# Patient Record
Sex: Female | Born: 1990 | Race: Black or African American | Hispanic: No | Marital: Single | Smoking: Former smoker
Health system: Southern US, Community
[De-identification: ages and names within clinical notes are randomized; demographics above are authoritative.]

## PROBLEM LIST (undated history)

## (undated) ENCOUNTER — Inpatient Hospital Stay (HOSPITAL_COMMUNITY): Payer: Self-pay

## (undated) DIAGNOSIS — K8689 Other specified diseases of pancreas: Secondary | ICD-10-CM

## (undated) DIAGNOSIS — K047 Periapical abscess without sinus: Secondary | ICD-10-CM

## (undated) DIAGNOSIS — N200 Calculus of kidney: Secondary | ICD-10-CM

## (undated) DIAGNOSIS — Z789 Other specified health status: Secondary | ICD-10-CM

## (undated) HISTORY — PX: NO PAST SURGERIES: SHX2092

---

## 2010-12-02 DIAGNOSIS — O365990 Maternal care for other known or suspected poor fetal growth, unspecified trimester, not applicable or unspecified: Secondary | ICD-10-CM

## 2014-10-25 ENCOUNTER — Encounter (HOSPITAL_COMMUNITY): Payer: Self-pay | Admitting: *Deleted

## 2014-10-25 ENCOUNTER — Emergency Department (HOSPITAL_COMMUNITY)
Admission: EM | Admit: 2014-10-25 | Discharge: 2014-10-25 | Disposition: A | Discharge status: Home / Self Care | Payer: Self-pay | Attending: Emergency Medicine | Admitting: Emergency Medicine

## 2014-10-25 DIAGNOSIS — K0889 Other specified disorders of teeth and supporting structures: Secondary | ICD-10-CM

## 2014-10-25 DIAGNOSIS — K002 Abnormalities of size and form of teeth: Secondary | ICD-10-CM | POA: Insufficient documentation

## 2014-10-25 DIAGNOSIS — K088 Other specified disorders of teeth and supporting structures: Secondary | ICD-10-CM | POA: Insufficient documentation

## 2014-10-25 DIAGNOSIS — Z720 Tobacco use: Secondary | ICD-10-CM | POA: Insufficient documentation

## 2014-10-25 DIAGNOSIS — H9209 Otalgia, unspecified ear: Secondary | ICD-10-CM | POA: Insufficient documentation

## 2014-10-25 MED ORDER — OXYCODONE-ACETAMINOPHEN 5-325 MG PO TABS: 1.0000 | ORAL_TABLET | ORAL | Status: DC | PRN

## 2014-10-25 MED ORDER — PENICILLIN V POTASSIUM 250 MG PO TABS
500.0000 mg | ORAL_TABLET | Freq: Once | ORAL | Status: AC
  Administered 2014-10-25: 500 mg via ORAL
  Filled 2014-10-25: qty 2

## 2014-10-25 MED ORDER — OXYCODONE-ACETAMINOPHEN 5-325 MG PO TABS
2.0000 | ORAL_TABLET | Freq: Once | ORAL | Status: AC
  Administered 2014-10-25: 2 via ORAL
  Filled 2014-10-25: qty 2

## 2014-10-25 MED ORDER — PENICILLIN V POTASSIUM 500 MG PO TABS: 500.0000 mg | ORAL_TABLET | Freq: Four times a day (QID) | ORAL | Status: DC

## 2014-10-25 NOTE — ED Provider Notes (Signed)
CSN: [id number]     Arrival date & time 10/25/14  1215 History   First MD Initiated Contact with Patient 10/25/14 1223     Chief Complaint  Patient presents with  . Dental Pain  . Otalgia     (Consider location/radiation/quality/duration/timing/severity/associated sxs/prior Treatment) HPI Comments: The patient is a 24 year old otherwise healthy female who presents with dental pain that started gradually 2 days ago. The dental pain is severe, constant and progressively worsening. The pain is aching and located in left lower molars. The pain does not radiate. Eating makes the pain worse. Nothing makes the pain better. The patient has tried ibuprofen for pain without relief. She reports associated facial swelling and ear pain. Patient denies headache, neck pain/stiffness, fever, NVD, edema, sore throat, throat swelling, wheezing, SOB, chest pain, abdominal pain.     Patient is a 24 y.o. female presenting with tooth pain and ear pain.  Dental Pain Associated symptoms: facial swelling   Associated symptoms: no fever and no neck pain   Otalgia Associated symptoms: no abdominal pain, no diarrhea, no fever, no neck pain and no vomiting     History reviewed. No pertinent past medical history. History reviewed. No pertinent past surgical history. History reviewed. No pertinent family history. History  Substance Use Topics  . Smoking status: Current Every Day Smoker    Types: Cigarettes  . Smokeless tobacco: Not on file  . Alcohol Use: No   OB History    No data available     Review of Systems  Constitutional: Negative for fever, chills and fatigue.  HENT: Positive for dental problem, ear pain and facial swelling. Negative for trouble swallowing.   Eyes: Negative for visual disturbance.  Respiratory: Negative for shortness of breath.   Cardiovascular: Negative for chest pain and palpitations.  Gastrointestinal: Negative for nausea, vomiting, abdominal pain and diarrhea.   Genitourinary: Negative for dysuria and difficulty urinating.  Musculoskeletal: Negative for arthralgias and neck pain.  Skin: Negative for color change.  Neurological: Negative for dizziness and weakness.  Psychiatric/Behavioral: Negative for dysphoric mood.      Allergies  Review of patient's allergies indicates no known allergies.  Home Medications   Prior to Admission medications   Medication Sig Start Date End Date Taking? Authorizing Provider  ibuprofen (ADVIL,MOTRIN) 400 MG tablet Take 400 mg by mouth every 6 (six) hours as needed for mild pain.   Yes Historical Provider, MD   BP 130/72 mmHg  Pulse 79  Temp(Src) 98.3 F (36.8 C) (Oral)  Resp 20  SpO2 99%  LMP 10/03/2014 Physical Exam  Constitutional: She is oriented to person, place, and time. She appears well-developed and well-nourished. No distress.  HENT:  Head: Normocephalic and atraumatic.  Mouth/Throat: Oropharynx is clear and moist. No oropharyngeal exudate.  Poor dentition. Left lower molar mild tenderness to percussion. No abscess noted.   Eyes: Conjunctivae and EOM are normal.  Neck: Normal range of motion. Neck supple.  Left side cervical adenopathy  Cardiovascular: Normal rate and regular rhythm.  Exam reveals no gallop and no friction rub.   No murmur heard. Pulmonary/Chest: Effort normal and breath sounds normal. She has no wheezes. She has no rales. She exhibits no tenderness.  Abdominal: Soft. There is no tenderness.  Musculoskeletal: Normal range of motion.  Lymphadenopathy:    She has cervical adenopathy.  Neurological: She is alert and oriented to person, place, and time. Coordination normal.  Speech is goal-oriented. Moves limbs without ataxia.   Skin: Skin is  warm and dry.  Psychiatric: She has a normal mood and affect. Her behavior is normal.  Nursing note and vitals reviewed.   ED Course  Procedures (including critical care time) Labs Review Labs Reviewed - No data to  display  Imaging Review No results found.   EKG Interpretation None      MDM   Final diagnoses:  Pain, dental    1:03 PM No signs of ludwigs angina. Vitals stable and patient afebrile. No abscess noted or throat swelling. Patient will be discharged with Veetid and Percocet and instructions to follow up with on call dentist. Patient instructed to return to the ED with worsening or concerning symptoms.   Emilia Beck, PA-C 10/25/14 1311  Richardean Canal, MD 10/25/14 [phone]

## 2014-10-25 NOTE — ED Notes (Addendum)
Pt reports left lower dental pain intermittent for extended amount of time. Now has pain and swelling into left ear and into her throat, reports difficulty swallowing. Airway intact.

## 2014-10-25 NOTE — Discharge Instructions (Signed)
Take veetid as directed until gone. Take percocet as needed for pain. Follow up with the recommended dentist. Refer to attached documents for more information.

## 2014-10-27 ENCOUNTER — Emergency Department (HOSPITAL_COMMUNITY): Payer: Self-pay

## 2014-10-27 ENCOUNTER — Encounter (HOSPITAL_COMMUNITY): Payer: Self-pay | Admitting: *Deleted

## 2014-10-27 ENCOUNTER — Emergency Department (HOSPITAL_COMMUNITY)
Admission: EM | Admit: 2014-10-27 | Discharge: 2014-10-27 | Disposition: A | Discharge status: Home / Self Care | Payer: Self-pay | Attending: Emergency Medicine | Admitting: Emergency Medicine

## 2014-10-27 DIAGNOSIS — K0889 Other specified disorders of teeth and supporting structures: Secondary | ICD-10-CM

## 2014-10-27 DIAGNOSIS — K029 Dental caries, unspecified: Secondary | ICD-10-CM | POA: Insufficient documentation

## 2014-10-27 DIAGNOSIS — K047 Periapical abscess without sinus: Secondary | ICD-10-CM | POA: Insufficient documentation

## 2014-10-27 DIAGNOSIS — Z792 Long term (current) use of antibiotics: Secondary | ICD-10-CM | POA: Insufficient documentation

## 2014-10-27 DIAGNOSIS — Z720 Tobacco use: Secondary | ICD-10-CM | POA: Insufficient documentation

## 2014-10-27 DIAGNOSIS — K088 Other specified disorders of teeth and supporting structures: Secondary | ICD-10-CM | POA: Insufficient documentation

## 2014-10-27 HISTORY — DX: Periapical abscess without sinus: K04.7

## 2014-10-27 LAB — BASIC METABOLIC PANEL
ANION GAP: 8 (ref 5–15)
BUN: 8 mg/dL (ref 6–20)
CALCIUM: 9.5 mg/dL (ref 8.9–10.3)
CHLORIDE: 102 mmol/L (ref 101–111)
CO2: 25 mmol/L (ref 22–32)
Creatinine, Ser: 0.8 mg/dL (ref 0.44–1.00)
GFR calc non Af Amer: >60 mL/min (ref >60)
Glucose, Bld: 98 mg/dL (ref 65–99)
Potassium: 3.9 mmol/L (ref 3.5–5.1)
SODIUM: 135 mmol/L (ref 135–145)

## 2014-10-27 LAB — CBC WITH DIFFERENTIAL/PLATELET
BASOS ABS: 0 10*3/uL (ref 0.0–0.1)
BASOS PCT: 0 % (ref 0–1)
EOS PCT: 2 % (ref 0–5)
Eosinophils Absolute: 0.1 10*3/uL (ref 0.0–0.7)
HCT: 39.8 % (ref 36.0–46.0)
HEMOGLOBIN: 13.7 g/dL (ref 12.0–15.0)
LYMPHS ABS: 1.4 10*3/uL (ref 0.7–4.0)
Lymphocytes Relative: 28 % (ref 12–46)
MCH: 31.2 pg (ref 26.0–34.0)
MCHC: 34.4 g/dL (ref 30.0–36.0)
MCV: 90.7 fL (ref 78.0–100.0)
Monocytes Absolute: 0.9 10*3/uL (ref 0.1–1.0)
Monocytes Relative: 17 % — ABNORMAL HIGH (ref 3–12)
NEUTROS ABS: 2.7 10*3/uL (ref 1.7–7.7)
Neutrophils Relative %: 53 % (ref 43–77)
PLATELETS: 283 10*3/uL (ref 150–400)
RBC: 4.39 MIL/uL (ref 3.87–5.11)
RDW: 13 % (ref 11.5–15.5)
WBC: 5.1 10*3/uL (ref 4.0–10.5)

## 2014-10-27 MED ORDER — IOHEXOL 300 MG/ML  SOLN
75.0000 mL | Freq: Once | INTRAMUSCULAR | Status: AC | PRN
  Administered 2014-10-27: 75 mL via INTRAVENOUS

## 2014-10-27 NOTE — ED Notes (Signed)
Pt was seen and treated on 10-25-14 for dental pain. Today Pt presents with swelling under tongue making it hard to swallow. Pt speaking in full sentences and no resp. Distress.

## 2014-10-27 NOTE — Discharge Instructions (Signed)

## 2014-10-27 NOTE — ED Provider Notes (Signed)
CSN: [id number]     Arrival date & time 10/27/14  1247 History  This chart was scribed for non-physician practitioner, Santiago Glad, PA-C, working with Richardean Canal, MD, by Ronney Lion, ED Scribe. This patient was seen in room TR07C/TR07C and the patient's care was started at 1:21 PM.     Chief Complaint  Patient presents with  . Oral Swelling   The history is provided by the patient. No language interpreter was used.     HPI Comments: Beth Morrow is a 24 y.o. female who presents to the Emergency Department complaining of constant pain and swelling underneath her tongue that began today. She states she had difficulty eating yesterday due to the swelling, although she states she was able to eat pizza at midnight last night, about 13 hours ago. Patient was seen and treated here 2 days ago for dental pain, adding that she did not have of today's symptoms then. She was given Rx for Percocet and PCN at that time, which she reports that she is taking.  She does mention that her dental pain is no longer bothering her. She denies fever, nausea, vomiting, sore throat, or SOB.   Past Medical History  Diagnosis Date  . Dental abscess    History reviewed. No pertinent past surgical history. History reviewed. No pertinent family history. History  Substance Use Topics  . Smoking status: Current Every Day Smoker    Types: Cigarettes  . Smokeless tobacco: Not on file  . Alcohol Use: No   OB History    No data available     Review of Systems  Constitutional: Negative for fever.  HENT: Positive for dental problem. Negative for drooling and facial swelling.        Positive for sublingual pain and swelling  Respiratory: Negative for shortness of breath.   Gastrointestinal: Negative for nausea and vomiting.      Allergies  Review of patient's allergies indicates no known allergies.  Home Medications   Prior to Admission medications   Medication Sig Start Date End Date Taking?  Authorizing Provider  ibuprofen (ADVIL,MOTRIN) 400 MG tablet Take 400 mg by mouth every 6 (six) hours as needed for mild pain.    Historical Provider, MD  oxyCODONE-acetaminophen (PERCOCET/ROXICET) 5-325 MG per tablet Take 1-2 tablets by mouth every 4 (four) hours as needed for severe pain. 10/25/14   Kaitlyn Szekalski, PA-C  penicillin v potassium (VEETID) 500 MG tablet Take 1 tablet (500 mg total) by mouth 4 (four) times daily. 10/25/14   Kaitlyn Szekalski, PA-C   BP 128/79 mmHg  Pulse 84  Temp(Src) 98.6 F (37 C) (Oral)  Resp 16  Ht  (1.6 m)  Wt 133 lb 8 oz (60.555 kg)  BMI 23.65 kg/m2  SpO2 98%  LMP 10/03/2014 Physical Exam  Constitutional: She is oriented to person, place, and time. She appears well-developed and well-nourished. No distress.  HENT:  Head: Normocephalic and atraumatic.  Mouth/Throat: Uvula is midline. No trismus in the jaw. Dental caries present. No oropharyngeal exudate, posterior oropharyngeal edema or posterior oropharyngeal erythema.  No trismus. Some sublingual TTP, as well TTP to left lower gingiva. No obvious sublingual swelling  No dental abscess palpated or visualized.  Airway is patent. Patient handling secretions well, no drooling. Uvula midline.  Eyes: Conjunctivae and EOM are normal.  Neck: Neck supple. No tracheal deviation present.  Cardiovascular: Normal rate, regular rhythm and normal heart sounds.   Pulmonary/Chest: Effort normal and breath sounds normal. No respiratory distress.  Musculoskeletal: Normal range of motion.  Neurological: She is alert and oriented to person, place, and time.  Skin: Skin is warm and dry.  Psychiatric: She has a normal mood and affect. Her behavior is normal.  Nursing note and vitals reviewed.   ED Course  Procedures (including critical care time)  DIAGNOSTIC STUDIES: Oxygen Saturation is 98% on RA, normal by my interpretation.    COORDINATION OF CARE: 1:27 PM - Discussed treatment plan with pt at bedside  which includes consult with attending physician Dr. Silverio LayYao, and pt agreed to plan.  1:33 PM - Dr. Silverio LayYao advised blood tests and CT scan. Discussed this with pt, who verbalized understanding and agreed to plan.   3:53 PM - Pt made aware of lab and imaging results.    Labs Review Labs Reviewed  CBC WITH DIFFERENTIAL/PLATELET - Abnormal; Notable for the following:    Monocytes Relative 17 (*)    All other components within normal limits  BASIC METABOLIC PANEL   Imaging Review Ct Soft Tissue Neck W Contrast  10/27/2014   CLINICAL DATA:  Left-sided facial swelling for 2 days.  EXAM: CT NECK WITH CONTRAST  TECHNIQUE: Multidetector CT imaging of the neck was performed using the standard protocol following the bolus administration of intravenous contrast.  CONTRAST:  75mL OMNIPAQUE IOHEXOL 300 MG/ML  SOLN  COMPARISON:  None.  FINDINGS: Pharynx and larynx: No mass or abscess is identified. The epiglottis and area epiglottic folds are normal. The paraglottic fat planes are maintained. The piriform sinus and vallecular air spaces are unremarkable.  Salivary glands: Normal.  Thyroid: Normal.  Lymph nodes: Scattered neck nodes but no mass or overt lymphadenopathy.  Vascular: Major vascular structures are normal.  Limited intracranial: No significant findings.  Visualized orbits: Normal.  Mastoids and visualized paranasal sinuses: Clear.  Skeleton: No significant bony findings.  Dental: Dental caries are noted most significantly involving the right maxilla and mandibular molars. Significant artifact dental fillings.  Upper chest: The lung apices are clear. No mediastinal mass. Normal thymic tissue noted in the anterior mediastinum.  IMPRESSION: No mass, abscess or lymphadenopathy is identified.  Dental caries.   Electronically Signed   By: Rudie MeyerP.  Gallerani M.D.   On: 10/27/2014 15:31    MDM   Final diagnoses:  None   Patient presents today with sublingual tenderness.  She was seen in the ED two days ago for dental  pain and given Rx for Percocet and PCN, which she reports that she is taking.  No trismus.  Airway widely patent.  CT neck today is negative aside from dental caries.  Feel that the patient is stable for discharge.  Instructed to follow up with dentist.  Return precautions given..    I personally performed the services described in this documentation, which was scribed in my presence. The recorded information has been reviewed and is accurate.   Santiago GladHeather Makael Stein, PA-C 10/27/14 2140  Richardean Canalavid H Yao, MD 10/28/14 [phone]

## 2014-10-27 NOTE — ED Notes (Signed)
Declined W/C at D/C and was escorted to lobby by RN. 

## 2017-05-19 LAB — OB RESULTS CONSOLE ABO/RH: RH TYPE: POSITIVE

## 2017-05-19 LAB — OB RESULTS CONSOLE HGB/HCT, BLOOD
HCT: 38
Hemoglobin: 13.1

## 2017-05-19 LAB — OB RESULTS CONSOLE HEPATITIS B SURFACE ANTIGEN: Hepatitis B Surface Ag: NEGATIVE

## 2017-05-19 LAB — HIV ANTIBODY (ROUTINE TESTING W REFLEX): HIV SCREEN 4TH GENERATION: NONREACTIVE

## 2017-05-19 LAB — OB RESULTS CONSOLE ANTIBODY SCREEN: ANTIBODY SCREEN: NEGATIVE

## 2017-05-19 LAB — OB RESULTS CONSOLE HIV ANTIBODY (ROUTINE TESTING): HIV: NONREACTIVE

## 2017-05-19 LAB — OB RESULTS CONSOLE RUBELLA ANTIBODY, IGM: Rubella: IMMUNE

## 2017-05-19 LAB — OB RESULTS CONSOLE RPR: RPR: NONREACTIVE

## 2017-05-19 LAB — OB RESULTS CONSOLE PLATELET COUNT: PLATELETS: 296

## 2017-06-06 NOTE — L&D Delivery Note (Signed)
Delivery Note Pt became complete at 0011 after presenting in early labor and requiring Pit/AROM for augmentation. She pushed well and at 12:39 AM a viable female was delivered via Vaginal, Spontaneous (Presentation: ROA).  APGAR: 9, 9; weight: pending.  Infant dried and placed on pt's abd; cord clamped and cut by FOB. Hospital cord blood sample collected. Placenta status: spont ,intact .  Cord: 3 vessel  Incidental finding: approx 1L of MSF present at delivery (no amnioinfusion)  Anesthesia:  Epidural Episiotomy: None Lacerations: None Est. Blood Loss (mL): 100  Mom to postpartum.  Baby to Couplet care / Skin to Skin.  Cam HaiSHAW, KIMBERLY CNM 01/04/2018, 1:04 AM  Please schedule this patient for Postpartum visit in: 4 weeks with the following provider: Any provider For C/S patients schedule nurse incision check in weeks 2 weeks: no Low risk pregnancy complicated by: ? Pancreatic mass Delivery mode:  SVD Anticipated Birth Control:  Depo PP Procedures needed: f/u with GI to eval for possible pancreatic mass- please set this up for 4-6wk PP; she may also need a Pap due to ASCUS? Schedule Integrated BH visit: no

## 2017-06-15 ENCOUNTER — Other Ambulatory Visit (HOSPITAL_COMMUNITY)
Admission: RE | Admit: 2017-06-15 | Discharge: 2017-06-15 | Disposition: A | Discharge status: Home / Self Care | Payer: Medicaid Other | Source: Ambulatory Visit | Attending: Obstetrics & Gynecology | Admitting: Obstetrics & Gynecology

## 2017-06-15 ENCOUNTER — Encounter: Payer: Self-pay | Admitting: Obstetrics & Gynecology

## 2017-06-15 ENCOUNTER — Encounter (HOSPITAL_COMMUNITY): Payer: Self-pay | Admitting: Obstetrics & Gynecology

## 2017-06-15 ENCOUNTER — Ambulatory Visit (INDEPENDENT_AMBULATORY_CARE_PROVIDER_SITE_OTHER): Payer: Medicaid Other | Admitting: Obstetrics & Gynecology

## 2017-06-15 DIAGNOSIS — Z3480 Encounter for supervision of other normal pregnancy, unspecified trimester: Secondary | ICD-10-CM | POA: Diagnosis present

## 2017-06-15 DIAGNOSIS — Z3482 Encounter for supervision of other normal pregnancy, second trimester: Secondary | ICD-10-CM | POA: Diagnosis not present

## 2017-06-15 DIAGNOSIS — Z3490 Encounter for supervision of normal pregnancy, unspecified, unspecified trimester: Secondary | ICD-10-CM | POA: Insufficient documentation

## 2017-06-15 DIAGNOSIS — O09292 Supervision of pregnancy with other poor reproductive or obstetric history, second trimester: Secondary | ICD-10-CM

## 2017-06-15 DIAGNOSIS — O09299 Supervision of pregnancy with other poor reproductive or obstetric history, unspecified trimester: Secondary | ICD-10-CM | POA: Insufficient documentation

## 2017-06-15 NOTE — Progress Notes (Signed)
  Subjective:    Beth Morrow is a G2P1001 3628w0d being seen today for her first obstetrical visit.  Her obstetrical history is significant for intrauterine growth restriction (IUGR) and in previous pregnancy. Patient does not intend to breast feed. Pregnancy history fully reviewed.  Patient reports no complaints.  Vitals:   06/15/17 1056  BP: 112/71  Pulse: 70  Weight: 135 lb 6.4 oz (61.4 kg)    HISTORY: OB History  Gravida Para Term Preterm AB Living  2 1 1     1   SAB TAB Ectopic Multiple Live Births          1    # Outcome Date GA Lbr Len/2nd Weight Sex Delivery Anes PTL Lv  2 Current           1 Term 12/02/10 2563w5d  5 lb 7 oz (2.466 kg) M Vag-Spont EPI  LIV     Complications: IUGR (intrauterine growth restriction) affecting care of mother     Past Medical History:  Diagnosis Date  . Dental abscess    History reviewed. No pertinent surgical history. Family History  Adopted: Yes     Exam    Uterus:     Pelvic Exam:    Perineum: No Hemorrhoids   Vulva: normal   Vagina:  thin grey discharge, wet prep done   pH:    Cervix: no lesions   Adnexa: no mass, fullness, tenderness   Bony Pelvis: average  System: Breast:  normal appearance, no masses or tenderness, bilateral piercing   Skin: normal coloration and turgor, no rashes    Neurologic: oriented, normal mood   Extremities: normal strength, tone, and muscle mass   HEENT extra ocular movement intact, sclera clear, anicteric and neck supple with midline trachea   Mouth/Teeth mucous membranes moist, pharynx normal without lesions and dental hygiene good   Neck supple and no masses   Cardiovascular: regular rate and rhythm, no murmurs or gallops   Respiratory:  appears well, vitals normal, no respiratory distress, acyanotic, normal RR, neck free of mass or lymphadenopathy, chest clear, no wheezing, crepitations, rhonchi, normal symmetric air entry   Abdomen: soft, non-tender; bowel sounds normal; no masses,   no organomegaly   Urinary: urethral meatus normal      Assessment:    Pregnancy: G2P1001 Patient Active Problem List   Diagnosis Date Noted  . Encounter for supervision of normal pregnancy, antepartum 06/15/2017  . IUGR (intrauterine growth restriction) in prior pregnancy, pregnant 06/15/2017  . Dental abscess         Plan:     Initial labs drawn. Prenatal vitamins. Problem list reviewed and updated. Genetic Screening discussed First Screen: ordered.  Ultrasound discussed; fetal survey: 18 weeks.  Follow up in 4 weeks. 50% of 30 min visit spent on counseling and coordination of care.  Labs from [facility] pending   Scheryl DarterJames Arnold 06/15/2017

## 2017-06-15 NOTE — Progress Notes (Signed)
Pt c/o yeast infection. No relief with OTC meds.

## 2017-06-15 NOTE — Patient Instructions (Signed)
First Trimester of Pregnancy The first trimester of pregnancy is from week 1 until the end of week 13 (months 1 through 3). A week after a sperm fertilizes an egg, the egg will implant on the wall of the uterus. This embryo will begin to develop into a baby. Genes from you and your partner will form the baby. The female genes will determine whether the baby will be a boy or a girl. At 6-8 weeks, the eyes and face will be formed, and the heartbeat can be seen on ultrasound. At the end of 12 weeks, all the baby's organs will be formed. Now that you are pregnant, you will want to do everything you can to have a healthy baby. Two of the most important things are to get good prenatal care and to follow your health care provider's instructions. Prenatal care is all the medical care you receive before the baby's birth. This care will help prevent, find, and treat any problems during the pregnancy and childbirth. Body changes during your first trimester Your body goes through many changes during pregnancy. The changes vary from woman to woman.  You may gain or lose a couple of pounds at first.  You may feel sick to your stomach (nauseous) and you may throw up (vomit). If the vomiting is uncontrollable, call your health care provider.  You may tire easily.  You may develop headaches that can be relieved by medicines. All medicines should be approved by your health care provider.  You may urinate more often. Painful urination may mean you have a bladder infection.  You may develop heartburn as a result of your pregnancy.  You may develop constipation because certain hormones are causing the muscles that push stool through your intestines to slow down.  You may develop hemorrhoids or swollen veins (varicose veins).  Your breasts may begin to grow larger and become tender. Your nipples may stick out more, and the tissue that surrounds them (areola) may become darker.  Your gums may bleed and may be  sensitive to brushing and flossing.  Dark spots or blotches (chloasma, mask of pregnancy) may develop on your face. This will likely fade after the baby is born.  Your menstrual periods will stop.  You may have a loss of appetite.  You may develop cravings for certain kinds of food.  You may have changes in your emotions from day to day, such as being excited to be pregnant or being concerned that something may go wrong with the pregnancy and baby.  You may have more vivid and strange dreams.  You may have changes in your hair. These can include thickening of your hair, rapid growth, and changes in texture. Some women also have hair loss during or after pregnancy, or hair that feels dry or thin. Your hair will most likely return to normal after your baby is born.  What to expect at prenatal visits During a routine prenatal visit:  You will be weighed to make sure you and the baby are growing normally.  Your blood pressure will be taken.  Your abdomen will be measured to track your baby's growth.  The fetal heartbeat will be listened to between weeks 10 and 14 of your pregnancy.  Test results from any previous visits will be discussed.  Your health care provider may ask you:  How you are feeling.  If you are feeling the baby move.  If you have had any abnormal symptoms, such as leaking fluid, bleeding, severe headaches,   or abdominal cramping.  If you are using any tobacco products, including cigarettes, chewing tobacco, and electronic cigarettes.  If you have any questions.  Other tests that may be performed during your first trimester include:  Blood tests to find your blood type and to check for the presence of any previous infections. The tests will also be used to check for low iron levels (anemia) and protein on red blood cells (Rh antibodies). Depending on your risk factors, or if you previously had diabetes during pregnancy, you may have tests to check for high blood  sugar that affects pregnant women (gestational diabetes).  Urine tests to check for infections, diabetes, or protein in the urine.  An ultrasound to confirm the proper growth and development of the baby.  Fetal screens for spinal cord problems (spina bifida) and Down syndrome.  HIV (human immunodeficiency virus) testing. Routine prenatal testing includes screening for HIV, unless you choose not to have this test.  You may need other tests to make sure you and the baby are doing well.  Follow these instructions at home: Medicines  Follow your health care provider's instructions regarding medicine use. Specific medicines may be either safe or unsafe to take during pregnancy.  Take a prenatal vitamin that contains at least 600 micrograms (mcg) of folic acid.  If you develop constipation, try taking a stool softener if your health care provider approves. Eating and drinking  Eat a balanced diet that includes fresh fruits and vegetables, whole grains, good sources of protein such as meat, eggs, or tofu, and low-fat dairy. Your health care provider will help you determine the amount of weight gain that is right for you.  Avoid raw meat and uncooked cheese. These carry germs that can cause birth defects in the baby.  Eating four or five small meals rather than three large meals a day may help relieve nausea and vomiting. If you start to feel nauseous, eating a few soda crackers can be helpful. Drinking liquids between meals, instead of during meals, also seems to help ease nausea and vomiting.  Limit foods that are high in fat and processed sugars, such as fried and sweet foods.  To prevent constipation: ? Eat foods that are high in fiber, such as fresh fruits and vegetables, whole grains, and beans. ? Drink enough fluid to keep your urine clear or pale yellow. Activity  Exercise only as directed by your health care provider. Most women can continue their usual exercise routine during  pregnancy. Try to exercise for 30 minutes at least 5 days a week. Exercising will help you: ? Control your weight. ? Stay in shape. ? Be prepared for labor and delivery.  Experiencing pain or cramping in the lower abdomen or lower back is a good sign that you should stop exercising. Check with your health care provider before continuing with normal exercises.  Try to avoid standing for long periods of time. Move your legs often if you must stand in one place for a long time.  Avoid heavy lifting.  Wear low-heeled shoes and practice good posture.  You may continue to have sex unless your health care provider tells you not to. Relieving pain and discomfort  Wear a good support bra to relieve breast tenderness.  Take warm sitz baths to soothe any pain or discomfort caused by hemorrhoids. Use hemorrhoid cream if your health care provider approves.  Rest with your legs elevated if you have leg cramps or low back pain.  If you develop   varicose veins in your legs, wear support hose. Elevate your feet for 15 minutes, 3-4 times a day. Limit salt in your diet. Prenatal care  Schedule your prenatal visits by the twelfth week of pregnancy. They are usually scheduled monthly at first, then more often in the last 2 months before delivery.  Write down your questions. Take them to your prenatal visits.  Keep all your prenatal visits as told by your health care provider. This is important. Safety  Wear your seat belt at all times when driving.  Make a list of emergency phone numbers, including numbers for family, friends, the hospital, and police and fire departments. General instructions  Ask your health care provider for a referral to a local prenatal education class. Begin classes no later than the beginning of month 6 of your pregnancy.  Ask for help if you have counseling or nutritional needs during pregnancy. Your health care provider can offer advice or refer you to specialists for help  with various needs.  Do not use hot tubs, steam rooms, or saunas.  Do not douche or use tampons or scented sanitary pads.  Do not cross your legs for long periods of time.  Avoid cat litter boxes and soil used by cats. These carry germs that can cause birth defects in the baby and possibly loss of the fetus by miscarriage or stillbirth.  Avoid all smoking, herbs, alcohol, and medicines not prescribed by your health care provider. Chemicals in these products affect the formation and growth of the baby.  Do not use any products that contain nicotine or tobacco, such as cigarettes and e-cigarettes. If you need help quitting, ask your health care provider. You may receive counseling support and other resources to help you quit.  Schedule a dentist appointment. At home, brush your teeth with a soft toothbrush and be gentle when you floss. Contact a health care provider if:  You have dizziness.  You have mild pelvic cramps, pelvic pressure, or nagging pain in the abdominal area.  You have persistent nausea, vomiting, or diarrhea.  You have a bad smelling vaginal discharge.  You have pain when you urinate.  You notice increased swelling in your face, hands, legs, or ankles.  You are exposed to fifth disease or chickenpox.  You are exposed to German measles (rubella) and have never had it. Get help right away if:  You have a fever.  You are leaking fluid from your vagina.  You have spotting or bleeding from your vagina.  You have severe abdominal cramping or pain.  You have rapid weight gain or loss.  You vomit blood or material that looks like coffee grounds.  You develop a severe headache.  You have shortness of breath.  You have any kind of trauma, such as from a fall or a car accident. Summary  The first trimester of pregnancy is from week 1 until the end of week 13 (months 1 through 3).  Your body goes through many changes during pregnancy. The changes vary from  woman to woman.  You will have routine prenatal visits. During those visits, your health care provider will examine you, discuss any test results you may have, and talk with you about how you are feeling. This information is not intended to replace advice given to you by your health care provider. Make sure you discuss any questions you have with your health care provider. Document Released: 05/17/2001 Document Revised: 05/04/2016 Document Reviewed: 05/04/2016 Elsevier Interactive Patient Education  2018 [employer]vier   Inc[employer]  

## 2017-06-19 LAB — CERVICOVAGINAL ANCILLARY ONLY
Bacterial vaginitis: NEGATIVE
CANDIDA VAGINITIS: NEGATIVE
CHLAMYDIA, DNA PROBE: NEGATIVE
Neisseria Gonorrhea: NEGATIVE
Trichomonas: NEGATIVE

## 2017-06-21 ENCOUNTER — Encounter: Payer: Self-pay | Admitting: *Deleted

## 2017-06-22 ENCOUNTER — Ambulatory Visit (HOSPITAL_COMMUNITY): Admission: RE | Admit: 2017-06-22 | Payer: Medicaid Other | Source: Ambulatory Visit

## 2017-06-22 ENCOUNTER — Encounter (HOSPITAL_COMMUNITY): Payer: Self-pay

## 2017-06-22 ENCOUNTER — Other Ambulatory Visit: Payer: Self-pay | Admitting: Obstetrics & Gynecology

## 2017-06-22 ENCOUNTER — Other Ambulatory Visit (HOSPITAL_COMMUNITY): Payer: Self-pay | Admitting: *Deleted

## 2017-06-22 ENCOUNTER — Ambulatory Visit (HOSPITAL_COMMUNITY)
Admission: RE | Admit: 2017-06-22 | Discharge: 2017-06-22 | Disposition: A | Discharge status: Home / Self Care | Payer: Medicaid Other | Source: Ambulatory Visit | Attending: Obstetrics & Gynecology | Admitting: Obstetrics & Gynecology

## 2017-06-22 DIAGNOSIS — Z3480 Encounter for supervision of other normal pregnancy, unspecified trimester: Secondary | ICD-10-CM | POA: Insufficient documentation

## 2017-06-22 DIAGNOSIS — Z3682 Encounter for antenatal screening for nuchal translucency: Secondary | ICD-10-CM

## 2017-06-22 LAB — CYTOLOGY - PAP
Diagnosis: UNDETERMINED — AB
HPV: NOT DETECTED

## 2017-06-27 ENCOUNTER — Ambulatory Visit (HOSPITAL_COMMUNITY): Payer: Medicaid Other

## 2017-06-27 ENCOUNTER — Encounter (HOSPITAL_COMMUNITY): Payer: Self-pay

## 2017-06-27 ENCOUNTER — Ambulatory Visit (HOSPITAL_COMMUNITY)
Admission: RE | Admit: 2017-06-27 | Discharge: 2017-06-27 | Disposition: A | Discharge status: Home / Self Care | Payer: Medicaid Other | Source: Ambulatory Visit | Attending: Certified Nurse Midwife | Admitting: Certified Nurse Midwife

## 2017-07-13 ENCOUNTER — Ambulatory Visit (INDEPENDENT_AMBULATORY_CARE_PROVIDER_SITE_OTHER): Payer: Medicaid Other | Admitting: Obstetrics & Gynecology

## 2017-07-13 VITALS — BP 109/72 | HR 68 | Wt 140.6 lb

## 2017-07-13 DIAGNOSIS — Z3689 Encounter for other specified antenatal screening: Secondary | ICD-10-CM

## 2017-07-13 DIAGNOSIS — Z3480 Encounter for supervision of other normal pregnancy, unspecified trimester: Secondary | ICD-10-CM

## 2017-07-13 NOTE — Progress Notes (Signed)
Patient is in the office for ob visit, denies pain. 

## 2017-07-13 NOTE — Progress Notes (Signed)
   PRENATAL VISIT NOTE  Subjective:  Gwynneth Alimentrincess Schaben is a 27 y.o. G2P1001 at 1752w0d being seen today for ongoing prenatal care.  She is currently monitored for the following issues for this low-risk pregnancy and has Dental abscess; Encounter for supervision of normal pregnancy, antepartum; and IUGR (intrauterine growth restriction) in prior pregnancy, pregnant on their problem list.  Patient reports no complaints.  Contractions: Not present. Vag. Bleeding: None.   . Denies leaking of fluid.   The following portions of the patient's history were reviewed and updated as appropriate: allergies, current medications, past family history, past medical history, past social history, past surgical history and problem list. Problem list updated.  Objective:   Vitals:   07/13/17 0913  BP: 109/72  Pulse: 68  Weight: 140 lb 9.6 oz (63.8 kg)    Fetal Status: Fetal Heart Rate (bpm): 143         General:  Alert, oriented and cooperative. Patient is in no acute distress.  Skin: Skin is warm and dry. No rash noted.   Cardiovascular: Normal heart rate noted  Respiratory: Normal respiratory effort, no problems with respiration noted  Abdomen: Soft, gravid, appropriate for gestational age.  Pain/Pressure: Absent     Pelvic: Cervical exam deferred        Extremities: Normal range of motion.  Edema: None  Mental Status:  Normal mood and affect. Normal behavior. Normal judgment and thought content.   Assessment and Plan:  Pregnancy: G2P1001 at 4752w0d  1. Encounter for fetal anatomic survey Anatomy scan ordered - US MFM OB COMP + 14 WK; Future  2. Supervision of other normal pregnancy, antepartum Desires NIPS, unable to get NT done - MaterniT21  plus Core+ESS+SCA, Blood - US MFM OB COMP + 14 WK; Future No other complaints or concerns.  Routine obstetric precautions reviewed. Please refer to After Visit Summary for other counseling recommendations.  Return in about 4 weeks (around  08/10/2017).   Jaynie CollinsUgonna Devanee Pomplun, MD

## 2017-07-13 NOTE — Patient Instructions (Signed)
Return to clinic for any scheduled appointments or obstetric concerns, or go to MAU for evaluation  

## 2017-07-19 LAB — MATERNIT21  PLUS CORE+ESS+SCA, BLOOD
CHROMOSOME 13: NEGATIVE
CHROMOSOME 18: NEGATIVE
Chromosome 21: NEGATIVE
Y Chromosome: NOT DETECTED

## 2017-08-09 ENCOUNTER — Ambulatory Visit (HOSPITAL_COMMUNITY)
Admission: RE | Admit: 2017-08-09 | Discharge: 2017-08-09 | Disposition: A | Discharge status: Home / Self Care | Payer: Medicaid Other | Source: Ambulatory Visit | Attending: Obstetrics & Gynecology | Admitting: Obstetrics & Gynecology

## 2017-08-09 ENCOUNTER — Ambulatory Visit (INDEPENDENT_AMBULATORY_CARE_PROVIDER_SITE_OTHER): Payer: Medicaid Other | Admitting: Obstetrics and Gynecology

## 2017-08-09 ENCOUNTER — Other Ambulatory Visit: Payer: Self-pay | Admitting: Obstetrics & Gynecology

## 2017-08-09 ENCOUNTER — Encounter: Payer: Self-pay | Admitting: Obstetrics and Gynecology

## 2017-08-09 VITALS — BP 119/80 | HR 88 | Wt 148.2 lb

## 2017-08-09 DIAGNOSIS — Z3A19 19 weeks gestation of pregnancy: Secondary | ICD-10-CM

## 2017-08-09 DIAGNOSIS — O09292 Supervision of pregnancy with other poor reproductive or obstetric history, second trimester: Secondary | ICD-10-CM | POA: Diagnosis not present

## 2017-08-09 DIAGNOSIS — Z3689 Encounter for other specified antenatal screening: Secondary | ICD-10-CM

## 2017-08-09 DIAGNOSIS — Z8759 Personal history of other complications of pregnancy, childbirth and the puerperium: Secondary | ICD-10-CM

## 2017-08-09 DIAGNOSIS — O09299 Supervision of pregnancy with other poor reproductive or obstetric history, unspecified trimester: Secondary | ICD-10-CM

## 2017-08-09 DIAGNOSIS — Z3480 Encounter for supervision of other normal pregnancy, unspecified trimester: Secondary | ICD-10-CM

## 2017-08-09 MED ORDER — PREPLUS 27-1 MG PO TABS: 1.0000 | ORAL_TABLET | Freq: Every day | ORAL | 13 refills | Status: DC

## 2017-08-09 MED ORDER — CYCLOBENZAPRINE HCL 10 MG PO TABS: 10.0000 mg | ORAL_TABLET | Freq: Three times a day (TID) | ORAL | 1 refills | Status: DC | PRN

## 2017-08-09 NOTE — Patient Instructions (Signed)

## 2017-08-09 NOTE — Progress Notes (Signed)
nSubjective:  Beth Morrow is a 27 y.o. G2P1001 at 7440w6d being seen today for ongoing prenatal care.  She is currently monitored for the following issues for this high-risk pregnancy and has Dental abscess; Encounter for supervision of normal pregnancy, antepartum; and IUGR (intrauterine growth restriction) in prior pregnancy, pregnant on their problem list.  Patient reports backache.  Contractions: Not present. Vag. Bleeding: None.  Movement: Present. Denies leaking of fluid.   The following portions of the patient's history were reviewed and updated as appropriate: allergies, current medications, past family history, past medical history, past social history, past surgical history and problem list. Problem list updated.  Objective:   Vitals:   08/09/17 1009  BP: 119/80  Pulse: 88  Weight: 148 lb 3.2 oz (67.2 kg)    Fetal Status: Fetal Heart Rate (bpm): 165   Movement: Present     General:  Alert, oriented and cooperative. Patient is in no acute distress.  Skin: Skin is warm and dry. No rash noted.   Cardiovascular: Normal heart rate noted  Respiratory: Normal respiratory effort, no problems with respiration noted  Abdomen: Soft, gravid, appropriate for gestational age. Pain/Pressure: Absent     Pelvic:  Cervical exam deferred        Extremities: Normal range of motion.  Edema: None  Mental Status: Normal mood and affect. Normal behavior. Normal judgment and thought content.   Urinalysis:      Assessment and Plan:  Pregnancy: G2P1001 at 2940w6d  1. IUGR (intrauterine growth restriction) in prior pregnancy, pregnant U/S today  2. Supervision of other normal pregnancy, antepartum Stable Flexeril for back ache Release of medical records for prenatal labs  Preterm labor symptoms and general obstetric precautions including but not limited to vaginal bleeding, contractions, leaking of fluid and fetal movement were reviewed in detail with the patient. Please refer to After  Visit Summary for other counseling recommendations.  Return in about 4 weeks (around 09/06/2017) for OB visit.   Hermina StaggersErvin, Mabeline Varas L, MD

## 2017-09-06 ENCOUNTER — Ambulatory Visit (INDEPENDENT_AMBULATORY_CARE_PROVIDER_SITE_OTHER): Payer: Medicaid Other | Admitting: Obstetrics & Gynecology

## 2017-09-06 DIAGNOSIS — Z3480 Encounter for supervision of other normal pregnancy, unspecified trimester: Secondary | ICD-10-CM

## 2017-09-06 NOTE — Progress Notes (Signed)
   PRENATAL VISIT NOTE  Subjective:  Beth Morrow is a 27 y.o. G2P1001 at 644w6d being seen today for ongoing prenatal care.  She is currently monitored for the following issues for this high-risk pregnancy and has Dental abscess; Encounter for supervision of normal pregnancy, antepartum; and IUGR (intrauterine growth restriction) in prior pregnancy, pregnant on their problem list.  Patient reports cramps in left calf muscle.  Contractions: Not present. Vag. Bleeding: None.  Movement: Present. Denies leaking of fluid.   The following portions of the patient's history were reviewed and updated as appropriate: allergies, current medications, past family history, past medical history, past social history, past surgical history and problem list. Problem list updated.  Objective:   Vitals:   09/06/17 0829  BP: (!) 103/52  Pulse: 67  Weight: 152 lb (68.9 kg)    Fetal Status: Fetal Heart Rate (bpm): 140 Fundal Height: 24 cm Movement: Present     General:  Alert, oriented and cooperative. Patient is in no acute distress.  Skin: Skin is warm and dry. No rash noted.   Cardiovascular: Normal heart rate noted  Respiratory: Normal respiratory effort, no problems with respiration noted  Abdomen: Soft, gravid, appropriate for gestational age.  Pain/Pressure: Absent     Pelvic: Cervical exam deferred        Extremities: Normal range of motion.     Mental Status: Normal mood and affect. Normal behavior. Normal judgment and thought content.   Assessment and Plan:  Pregnancy: G2P1001 at 544w6d  1. Supervision of other normal pregnancy, antepartum Left leg examined, benign. Recommend massage calf, stretch, and may try tonic water po  Preterm labor symptoms and general obstetric precautions including but not limited to vaginal bleeding, contractions, leaking of fluid and fetal movement were reviewed in detail with the patient. Please refer to After Visit Summary for other counseling  recommendations.  Return in about 1 month (around 10/04/2017) for 2 hr.  Scheryl DarterJames Arnold, MD

## 2017-09-06 NOTE — Progress Notes (Signed)
Pt states she is having increase in leg cramps- "charlie horse".  Would like to know what's best for this.

## 2017-09-20 ENCOUNTER — Encounter: Payer: Self-pay | Admitting: Obstetrics & Gynecology

## 2017-09-25 ENCOUNTER — Encounter (HOSPITAL_COMMUNITY): Payer: Self-pay | Admitting: Emergency Medicine

## 2017-09-25 ENCOUNTER — Encounter (HOSPITAL_COMMUNITY): Payer: Self-pay

## 2017-09-25 ENCOUNTER — Emergency Department (HOSPITAL_COMMUNITY)
Admission: EM | Admit: 2017-09-25 | Discharge: 2017-09-25 | Disposition: A | Discharge status: Home / Self Care | Payer: Medicaid Other | Attending: Emergency Medicine | Admitting: Emergency Medicine

## 2017-09-25 ENCOUNTER — Inpatient Hospital Stay (HOSPITAL_COMMUNITY): Payer: Medicaid Other

## 2017-09-25 ENCOUNTER — Inpatient Hospital Stay (EMERGENCY_DEPARTMENT_HOSPITAL)
Admission: AD | Admit: 2017-09-25 | Discharge: 2017-09-26 | Disposition: A | Discharge status: Home / Self Care | Payer: Medicaid Other | Source: Ambulatory Visit | Attending: Obstetrics & Gynecology | Admitting: Obstetrics & Gynecology

## 2017-09-25 ENCOUNTER — Emergency Department (HOSPITAL_COMMUNITY): Payer: Medicaid Other

## 2017-09-25 DIAGNOSIS — R079 Chest pain, unspecified: Secondary | ICD-10-CM | POA: Diagnosis not present

## 2017-09-25 DIAGNOSIS — Z5321 Procedure and treatment not carried out due to patient leaving prior to being seen by health care provider: Secondary | ICD-10-CM | POA: Diagnosis not present

## 2017-09-25 DIAGNOSIS — O9989 Other specified diseases and conditions complicating pregnancy, childbirth and the puerperium: Secondary | ICD-10-CM

## 2017-09-25 DIAGNOSIS — Z3A26 26 weeks gestation of pregnancy: Secondary | ICD-10-CM

## 2017-09-25 DIAGNOSIS — R0602 Shortness of breath: Secondary | ICD-10-CM | POA: Diagnosis not present

## 2017-09-25 LAB — BASIC METABOLIC PANEL
Anion gap: 8 (ref 5–15)
BUN: <5 mg/dL — ABNORMAL LOW (ref 6–20)
CO2: 20 mmol/L — AB (ref 22–32)
CREATININE: 0.6 mg/dL (ref 0.44–1.00)
Calcium: 8.5 mg/dL — ABNORMAL LOW (ref 8.9–10.3)
Chloride: 107 mmol/L (ref 101–111)
GFR calc Af Amer: >60 mL/min (ref >60)
GFR calc non Af Amer: >60 mL/min (ref >60)
GLUCOSE: 102 mg/dL — AB (ref 65–99)
Potassium: 3.3 mmol/L — ABNORMAL LOW (ref 3.5–5.1)
Sodium: 135 mmol/L (ref 135–145)

## 2017-09-25 LAB — CBC
HCT: 33.3 % — ABNORMAL LOW (ref 36.0–46.0)
Hemoglobin: 11.4 g/dL — ABNORMAL LOW (ref 12.0–15.0)
MCH: 32.3 pg (ref 26.0–34.0)
MCHC: 34.2 g/dL (ref 30.0–36.0)
MCV: 94.3 fL (ref 78.0–100.0)
PLATELETS: 257 10*3/uL (ref 150–400)
RBC: 3.53 MIL/uL — ABNORMAL LOW (ref 3.87–5.11)
RDW: 13.7 % (ref 11.5–15.5)
WBC: 5.2 10*3/uL (ref 4.0–10.5)

## 2017-09-25 LAB — I-STAT BETA HCG BLOOD, ED (MC, WL, AP ONLY)

## 2017-09-25 LAB — I-STAT TROPONIN, ED: TROPONIN I, POC: 0 ng/mL (ref 0.00–0.08)

## 2017-09-25 MED ORDER — IOPAMIDOL (ISOVUE-370) INJECTION 76%
100.0000 mL | Freq: Once | INTRAVENOUS | Status: AC | PRN
  Administered 2017-09-25: 100 mL via INTRAVENOUS

## 2017-09-25 NOTE — MAU Provider Note (Addendum)
History     CSN: [id number]  Arrival date and time: 09/25/17 1658   First Provider Initiated Contact with Patient 09/25/17 1906      Chief Complaint  Patient presents with  . Shortness of Breath  . Chest Pain   HPI   Beth Morrow is a 27 y.o. female G2P1001 @[redacted]w[redacted]d  here in MAU with chest pain. Says she went on a trip over the weekend to [place] and was in the car for a long time. Initially she drove to [facility] and waited and then left there after 2 hours because of the wait time. Says the pain is in her upper chest and is constant. Rates her pain 0/10 now, says she feels better than she did this morning. She first felt it around lunch time. Says she ate a large fry around 11:00 and then started feeling the pain around 1200 noon. Right now she is not having any pain at all. Says she felt shortness of breath today while walking up the stairs and again at rest. No shortness of breath now. No leg pain. No history of PE, overall healthy.   OB History    Gravida  2   Para  1   Term  1   Preterm      AB      Living  1     SAB      TAB      Ectopic      Multiple      Live Births  1           Past Medical History:  Diagnosis Date  . Dental abscess     Past Surgical History:  Procedure Laterality Date  . NO PAST SURGERIES      Family History  Adopted: Yes    Social History   Tobacco Use  . Smoking status: Former Smoker    Types: Cigarettes  . Smokeless tobacco: Never Used  Substance Use Topics  . Alcohol use: No  . Drug use: No    Allergies: No Known Allergies  Medications Prior to Admission  Medication Sig Dispense Refill Last Dose  . cyclobenzaprine (FLEXERIL) 10 MG tablet Take 1 tablet (10 mg total) by mouth every 8 (eight) hours as needed for muscle spasms. 30 tablet 1   . Prenatal Vit w/Fe-Methylfol-FA (PNV PO) Take by mouth.   Taking  . Prenatal Vit-Fe Fumarate-FA (PREPLUS) 27-1 MG TABS Take 1 tablet by mouth daily. 30 tablet 13     Results for orders placed or performed during the hospital encounter of 09/25/17 (from the past 48 hour(s))  Basic metabolic panel     Status: Abnormal   Collection Time: 09/25/17  1:49 PM  Result Value Ref Range   Sodium 135 135 - 145 mmol/L   Potassium 3.3 (L) 3.5 - 5.1 mmol/L   Chloride 107 101 - 111 mmol/L   CO2 20 (L) 22 - 32 mmol/L   Glucose, Bld 102 (H) 65 - 99 mg/dL   BUN <5 (L) 6 - 20 mg/dL   Creatinine, Ser 0.60 0.44 - 1.00 mg/dL   Calcium 8.5 (L) 8.9 - 10.3 mg/dL   GFR calc non Af Amer >60 >60 mL/min   GFR calc Af Amer >60 >60 mL/min    Comment: (NOTE) The eGFR has been calculated using the CKD EPI equation. This calculation has not been validated in all clinical situations. eGFR's persistently <60 mL/min signify possible Chronic Kidney Disease.    Anion gap 8  5 - 15    Comment: Performed at [facility], [place] [address] [place], [place] [address]  CBC     Status: Abnormal   Collection Time: 09/25/17  1:49 PM  Result Value Ref Range   WBC 5.2 4.0 - 10.5 K/uL   RBC 3.53 (L) 3.87 - 5.11 MIL/uL   Hemoglobin 11.4 (L) 12.0 - 15.0 g/dL   HCT 33.3 (L) 36.0 - 46.0 %   MCV 94.3 78.0 - 100.0 fL   MCH 32.3 26.0 - 34.0 pg   MCHC 34.2 30.0 - 36.0 g/dL   RDW 13.7 11.5 - 15.5 %   Platelets 257 150 - 400 K/uL    Comment: Performed at [facility] [address], [place], [place] [address]  I-stat troponin, ED     Status: None   Collection Time: 09/25/17  2:20 PM  Result Value Ref Range   Troponin i, poc 0.00 0.00 - 0.08 ng/mL   Comment 3            Comment: Due to the release kinetics of cTnI, a negative result within the first hours of the onset of symptoms does not rule out myocardial infarction with certainty. If myocardial infarction is still suspected, repeat the test at appropriate intervals.   I-Stat beta hCG blood, ED     Status: Abnormal   Collection Time: 09/25/17  2:20 PM  Result Value Ref Range   I-stat hCG, quantitative >2,000.0 (H) <5 mIU/mL    Comment 3            Comment:   GEST. AGE      CONC.  (mIU/mL)   <=1 WEEK        5 - 50     2 WEEKS       50 - 500     3 WEEKS       100 - 10,000     4 WEEKS     1,000 - 30,000        FEMALE AND NON-PREGNANT FEMALE:     LESS THAN 5 mIU/mL     Review of Systems  Constitutional: Negative for fever.  Respiratory: Negative for cough and shortness of breath.    Physical Exam   Blood pressure 112/62, pulse 68, temperature 98.7 F (37.1 C), temperature source Oral, resp. rate 16, height 5' 2"  (1.575 m), weight 153 lb (69.4 kg), last menstrual period 03/23/2017, SpO2 99 %.  Physical Exam  Constitutional: She is oriented to person, place, and time. She appears well-developed and well-nourished.  Non-toxic appearance. She does not have a sickly appearance. She does not appear ill. No distress.  Respiratory: Effort normal and breath sounds normal. No respiratory distress. She has no wheezes. She has no rales. She exhibits no tenderness.  Patient attests to pain with deep inspiration.   Musculoskeletal: Normal range of motion.  Neurological: She is alert and oriented to person, place, and time.  Skin: Skin is warm. She is not diaphoretic.  Psychiatric: Her behavior is normal.    MAU Course  Procedures  None  MDM  Discussed patient with Dr. Rosana Hoes, given patient's history of traveling, shortness of breath and chest pain, will obtain CT scan tonight.  EKG WNL: Done at [facility] ED  Pulse ox 100% on RA. Patient able to hold a conversation while in MAU.  Patient awaiting CT scan, Report given to Fatima Blank CNM who resumes care of the patient.   Rasch, Artist Pais, NP  Assessment and Plan  Assumed care of patient at 2200.   Patient's CT shows fluid-filled sac in abdomen near pancreas; unsure if due to gravid uterus or if is part of pancreas.   Discussed with Dr. Rosana Hoes, who recommends that patient have outpatient MRCP.   Patient ambulating around the unit in no apparent distress.  She feels the SOB when she talking or walking. 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 She still feels the chest pain now.   NST: 140 bpm, mod var, present acel, neg decels.  No contractions.   1. Shortness of breath    2. Patient stable for discharge. Reviewed with patient the importance of taking deep breaths and the physiologic changes in pregnancy that can affect a woman's body.   3. Reviewed warning signs and when to return to MAU; she plans to keep prenatal visit on 10-04-2017.   4. Will order outpatient imaging to investigate fluid-filled sac.   5. Recommend that patient go to [facility] ED if she starts to feel worsening shortness of breath or chest pain.  Maye Hides CNM

## 2017-09-25 NOTE — MAU Note (Signed)
Urine sent to lab 

## 2017-09-25 NOTE — ED Triage Notes (Signed)
Pt to ER for evaluation of chest pressure and shortness of breath onset today, reports travel from [place] this morning, is [redacted] weeks pregnant, prenatal care up to date. Pt a/o x4. nad

## 2017-09-25 NOTE — ED Notes (Signed)
Pt came to desk and informed registration that she was leaving and going to women[facility] to be seen.

## 2017-09-25 NOTE — MAU Note (Signed)
Pt reports upper chest pain, states it was constant and above sternum. Pt  also reports when she is talking she feels short of breath. Pain has gotten better.

## 2017-09-26 ENCOUNTER — Other Ambulatory Visit: Payer: Self-pay | Admitting: Student

## 2017-09-26 LAB — AMYLASE: Amylase: 114 U/L — ABNORMAL HIGH (ref 28–100)

## 2017-09-26 NOTE — Discharge Instructions (Signed)
Shortness of Breath, Adult  Shortness of breath means you have trouble breathing. Your lungs are organs for breathing.  Follow these instructions at home:  Pay attention to any changes in your symptoms. Take these actions to help with your condition:  ? Do not smoke. Smoking can cause shortness of breath. If you need help to quit smoking, ask your doctor.  ? Avoid things that can make it harder to breathe, such as:  ? Mold.  ? Dust.  ? Air pollution.  ? Chemical smells.  ? Things that can cause allergy symptoms (allergens), if you have allergies.  ? Keep your living space clean and free of mold and dust.  ? Rest as needed. Slowly return to your usual activities.  ? Take over-the-counter and prescription medicines, including oxygen and inhaled medicines, only as told by your doctor.  ? Keep all follow-up visits as told by your doctor. This is important.  Contact a doctor if:  ? Your condition does not get better as soon as expected.  ? You have a hard time doing your normal activities, even after you rest.  ? You have new symptoms.  Get help right away if:  ? You have trouble breathing when you are resting.  ? You feel light-headed or you faint.  ? You have a cough that is not helped by medicines.  ? You cough up blood.  ? You have pain with breathing.  ? You have pain in your chest, arms, shoulders, or belly (abdomen).  ? You have a fever.  ? You cannot walk up stairs.  ? You cannot exercise the way you normally do.  This information is not intended to replace advice given to you by your health care provider. Make sure you discuss any questions you have with your health care provider.  Document Released: 11/09/2007 Document Revised: 06/09/2016 Document Reviewed: 06/09/2016  Elsevier Interactive Patient Education ? 2017 [employer]

## 2017-10-02 ENCOUNTER — Other Ambulatory Visit: Payer: Self-pay | Admitting: Obstetrics and Gynecology

## 2017-10-03 ENCOUNTER — Other Ambulatory Visit: Payer: Self-pay | Admitting: Student

## 2017-10-03 ENCOUNTER — Other Ambulatory Visit: Payer: Self-pay

## 2017-10-03 DIAGNOSIS — K862 Cyst of pancreas: Secondary | ICD-10-CM

## 2017-10-04 ENCOUNTER — Other Ambulatory Visit: Payer: Self-pay | Admitting: Obstetrics and Gynecology

## 2017-10-04 ENCOUNTER — Encounter: Payer: Self-pay | Admitting: Obstetrics & Gynecology

## 2017-10-04 ENCOUNTER — Ambulatory Visit (INDEPENDENT_AMBULATORY_CARE_PROVIDER_SITE_OTHER): Payer: Medicaid Other | Admitting: Obstetrics & Gynecology

## 2017-10-04 ENCOUNTER — Other Ambulatory Visit: Payer: Medicaid Other

## 2017-10-04 ENCOUNTER — Encounter: Payer: Self-pay | Admitting: *Deleted

## 2017-10-04 VITALS — BP 99/69 | HR 73 | Wt 152.4 lb

## 2017-10-04 DIAGNOSIS — Z3480 Encounter for supervision of other normal pregnancy, unspecified trimester: Secondary | ICD-10-CM

## 2017-10-04 DIAGNOSIS — Z3482 Encounter for supervision of other normal pregnancy, second trimester: Secondary | ICD-10-CM

## 2017-10-04 DIAGNOSIS — Z23 Encounter for immunization: Secondary | ICD-10-CM | POA: Diagnosis not present

## 2017-10-04 NOTE — Progress Notes (Signed)
   PRENATAL VISIT NOTE  Subjective:  Beth Morrow is a 27 y.o. G2P1001 at [redacted]w[redacted]d being seen today for ongoing prenatal care.  She is currently monitored for the following issues for this high-risk pregnancy and has Dental abscess; Encounter for supervision of normal pregnancy, antepartum; and IUGR (intrauterine growth restriction) in prior pregnancy, pregnant on their problem list.  Patient reports no complaints.  Contractions: Not present. Vag. Bleeding: None.  Movement: Present. Denies leaking of fluid.   The following portions of the patient's history were reviewed and updated as appropriate: allergies, current medications, past family history, past medical history, past social history, past surgical history and problem list. Problem list updated.  Objective:   Vitals:   10/04/17 0830  BP: 99/69  Pulse: 73  Weight: 152 lb 6.4 oz (69.1 kg)    Fetal Status: Fetal Heart Rate (bpm): 140   Movement: Present     General:  Alert, oriented and cooperative. Patient is in no acute distress.  Skin: Skin is warm and dry. No rash noted.   Cardiovascular: Normal heart rate noted  Respiratory: Normal respiratory effort, no problems with respiration noted  Abdomen: Soft, gravid, appropriate for gestational age.  Pain/Pressure: Absent     Pelvic: Cervical exam deferred        Extremities: Normal range of motion.  Edema: None  Mental Status: Normal mood and affect. Normal behavior. Normal judgment and thought content.   Assessment and Plan:  Pregnancy: G2P1001 at [redacted]w[redacted]d  1. Supervision of other normal pregnancy, antepartum Routine testing - Glucose Tolerance, 2 Hours w/1 Hour - CBC - HIV antibody (with reflex) - RPR Will defer pancreatic imaging until PP, abdominal US would be appropriate Preterm labor symptoms and general obstetric precautions including but not limited to vaginal bleeding, contractions, leaking of fluid and fetal movement were reviewed in detail with the  patient. Please refer to After Visit Summary for other counseling recommendations.  Return in about 2 weeks (around 10/18/2017).  No future appointments.  Scheryl Darter, MD

## 2017-10-04 NOTE — Patient Instructions (Signed)

## 2017-10-05 LAB — CBC
HEMATOCRIT: 35.6 % (ref 34.0–46.6)
HEMOGLOBIN: 11.6 g/dL (ref 11.1–15.9)
MCH: 31.7 pg (ref 26.6–33.0)
MCHC: 32.6 g/dL (ref 31.5–35.7)
MCV: 97 fL (ref 79–97)
Platelets: 265 10*3/uL (ref 150–379)
RBC: 3.66 x10E6/uL — AB (ref 3.77–5.28)
RDW: 14.4 % (ref 12.3–15.4)
WBC: 5.7 10*3/uL (ref 3.4–10.8)

## 2017-10-05 LAB — HIV ANTIBODY (ROUTINE TESTING W REFLEX): HIV Screen 4th Generation wRfx: NONREACTIVE

## 2017-10-05 LAB — GLUCOSE TOLERANCE, 2 HOURS W/ 1HR
Glucose, 1 hour: 82 mg/dL (ref 65–179)
Glucose, 2 hour: 78 mg/dL (ref 65–152)
Glucose, Fasting: 73 mg/dL (ref 65–91)

## 2017-10-05 LAB — RPR: RPR Ser Ql: NONREACTIVE

## 2017-10-10 ENCOUNTER — Other Ambulatory Visit: Payer: Self-pay | Admitting: Obstetrics and Gynecology

## 2017-10-10 DIAGNOSIS — K862 Cyst of pancreas: Secondary | ICD-10-CM

## 2017-10-10 NOTE — Progress Notes (Signed)
Patient with fluid density of 5-6 cm near pancreatic head, after discussion with GI, have ordered abdominal US to further characterize cyst.

## 2017-10-18 ENCOUNTER — Ambulatory Visit (INDEPENDENT_AMBULATORY_CARE_PROVIDER_SITE_OTHER): Payer: Medicaid Other | Admitting: Obstetrics and Gynecology

## 2017-10-18 ENCOUNTER — Encounter: Payer: Self-pay | Admitting: Obstetrics and Gynecology

## 2017-10-18 VITALS — BP 100/62 | HR 72 | Wt 150.4 lb

## 2017-10-18 DIAGNOSIS — K869 Disease of pancreas, unspecified: Secondary | ICD-10-CM

## 2017-10-18 DIAGNOSIS — O09293 Supervision of pregnancy with other poor reproductive or obstetric history, third trimester: Secondary | ICD-10-CM

## 2017-10-18 DIAGNOSIS — Z3480 Encounter for supervision of other normal pregnancy, unspecified trimester: Secondary | ICD-10-CM

## 2017-10-18 DIAGNOSIS — K8689 Other specified diseases of pancreas: Secondary | ICD-10-CM | POA: Insufficient documentation

## 2017-10-18 DIAGNOSIS — O09299 Supervision of pregnancy with other poor reproductive or obstetric history, unspecified trimester: Secondary | ICD-10-CM

## 2017-10-18 NOTE — Progress Notes (Signed)
   PRENATAL VISIT NOTE  Subjective:  Beth Morrow is a 27 y.o. G2P1001 at [redacted]w[redacted]d being seen today for ongoing prenatal care.  She is currently monitored for the following issues for this high-risk pregnancy and has Dental abscess; Encounter for supervision of normal pregnancy, antepartum; IUGR (intrauterine growth restriction) in prior pregnancy, pregnant; and Pancreatic mass on their problem list.  Patient reports no complaints.  Contractions: Not present. Vag. Bleeding: None.  Movement: Present. Denies leaking of fluid.   The following portions of the patient's history were reviewed and updated as appropriate: allergies, current medications, past family history, past medical history, past social history, past surgical history and problem list. Problem list updated.  Objective:   Vitals:   10/18/17 0825  BP: 100/62  Pulse: 72  Weight: 150 lb 6.4 oz (68.2 kg)    Fetal Status: Fetal Heart Rate (bpm): 140 Fundal Height: 30 cm Movement: Present     General:  Alert, oriented and cooperative. Patient is in no acute distress.  Skin: Skin is warm and dry. No rash noted.   Cardiovascular: Normal heart rate noted  Respiratory: Normal respiratory effort, no problems with respiration noted  Abdomen: Soft, gravid, appropriate for gestational age.  Pain/Pressure: Absent     Pelvic: Cervical exam deferred        Extremities: Normal range of motion.  Edema: None  Mental Status: Normal mood and affect. Normal behavior. Normal judgment and thought content.   Assessment and Plan:  Pregnancy: G2P1001 at [redacted]w[redacted]d  1. Supervision of other normal pregnancy, antepartum Patient is doing well without complaints Reviewed third trimester labs with patient  2. IUGR (intrauterine growth restriction) in prior pregnancy, pregnant Follow up growth ultrasound 6/3  3. Pancreatic mass Seen by GI who plans follow up and imaging postpartum  Preterm labor symptoms and general obstetric precautions including  but not limited to vaginal bleeding, contractions, leaking of fluid and fetal movement were reviewed in detail with the patient. Please refer to After Visit Summary for other counseling recommendations.  Return in about 2 weeks (around 11/01/2017) for ROB.  Future Appointments  Date Time Provider Department Center  11/01/2017  8:45 AM Conan Bowens, MD [facility] None  11/06/2017 10:00 AM [facility] [place] 3 [facility]    Catalina Antigua, MD

## 2017-10-27 ENCOUNTER — Encounter: Payer: Self-pay | Admitting: Obstetrics and Gynecology

## 2017-10-27 ENCOUNTER — Encounter: Payer: Self-pay | Admitting: Obstetrics & Gynecology

## 2017-10-31 ENCOUNTER — Other Ambulatory Visit: Payer: Self-pay

## 2017-10-31 NOTE — Progress Notes (Signed)
Accommodations faxed

## 2017-11-01 ENCOUNTER — Ambulatory Visit (INDEPENDENT_AMBULATORY_CARE_PROVIDER_SITE_OTHER): Payer: Medicaid Other | Admitting: Obstetrics and Gynecology

## 2017-11-01 ENCOUNTER — Encounter: Payer: Self-pay | Admitting: Obstetrics and Gynecology

## 2017-11-01 VITALS — BP 109/67 | HR 76 | Wt 153.4 lb

## 2017-11-01 DIAGNOSIS — K8689 Other specified diseases of pancreas: Secondary | ICD-10-CM

## 2017-11-01 DIAGNOSIS — Z3480 Encounter for supervision of other normal pregnancy, unspecified trimester: Secondary | ICD-10-CM

## 2017-11-01 DIAGNOSIS — O09299 Supervision of pregnancy with other poor reproductive or obstetric history, unspecified trimester: Secondary | ICD-10-CM

## 2017-11-01 DIAGNOSIS — K869 Disease of pancreas, unspecified: Secondary | ICD-10-CM

## 2017-11-01 NOTE — Progress Notes (Signed)
   PRENATAL VISIT NOTE  Subjective:  Beth Morrow is a 27 y.o. G2P1001 at [redacted]w[redacted]d being seen today for ongoing prenatal care.  She is currently monitored for the following issues for this high-risk pregnancy and has Dental abscess; Encounter for supervision of normal pregnancy, antepartum; IUGR (intrauterine growth restriction) in prior pregnancy, pregnant; and Pancreatic mass on their problem list.  Patient reports no complaints.  Contractions: Not present. Vag. Bleeding: None.  Movement: Present. Denies leaking of fluid.   The following portions of the patient's history were reviewed and updated as appropriate: allergies, current medications, past family history, past medical history, past social history, past surgical history and problem list. Problem list updated.  Objective:   Vitals:   11/01/17 0905  BP: 109/67  Pulse: 76  Weight: 153 lb 6.4 oz (69.6 kg)    Fetal Status: Fetal Heart Rate (bpm): 152 Fundal Height: 31 cm Movement: Present     General:  Alert, oriented and cooperative. Patient is in no acute distress.  Skin: Skin is warm and dry. No rash noted.   Cardiovascular: Normal heart rate noted  Respiratory: Normal respiratory effort, no problems with respiration noted  Abdomen: Soft, gravid, appropriate for gestational age.  Pain/Pressure: Absent     Pelvic: Cervical exam deferred        Extremities: Normal range of motion.  Edema: None  Mental Status: Normal mood and affect. Normal behavior. Normal judgment and thought content.   Assessment and Plan:  Pregnancy: G2P1001 at [redacted]w[redacted]d  1. Supervision of other normal pregnancy, antepartum  2. IUGR (intrauterine growth restriction) in prior pregnancy, pregnant Growth [place] scheduled for 11/08/17  3. Pancreatic mass Reviewed with GI, rec [place] To have abdominal US to further characterize mass and plan for post partum follow up   Preterm labor symptoms and general obstetric precautions including but not limited to vaginal  bleeding, contractions, leaking of fluid and fetal movement were reviewed in detail with the patient. Please refer to After Visit Summary for other counseling recommendations.  Return in about 2 weeks (around 11/15/2017) for OB visit (MD).  Future Appointments  Date Time Provider Department Center  11/08/2017  4:00 PM [facility] [place] 3 [facility]    Conan Bowens, MD

## 2017-11-06 ENCOUNTER — Ambulatory Visit (HOSPITAL_COMMUNITY): Payer: Medicaid Other

## 2017-11-08 ENCOUNTER — Ambulatory Visit (HOSPITAL_COMMUNITY)
Admission: RE | Admit: 2017-11-08 | Discharge: 2017-11-08 | Disposition: A | Discharge status: Home / Self Care | Payer: Medicaid Other | Source: Ambulatory Visit | Attending: Obstetrics & Gynecology | Admitting: Obstetrics & Gynecology

## 2017-11-08 ENCOUNTER — Other Ambulatory Visit: Payer: Self-pay | Admitting: Obstetrics & Gynecology

## 2017-11-08 DIAGNOSIS — Z362 Encounter for other antenatal screening follow-up: Secondary | ICD-10-CM | POA: Diagnosis not present

## 2017-11-08 DIAGNOSIS — O09293 Supervision of pregnancy with other poor reproductive or obstetric history, third trimester: Secondary | ICD-10-CM | POA: Diagnosis not present

## 2017-11-08 DIAGNOSIS — Z3A32 32 weeks gestation of pregnancy: Secondary | ICD-10-CM | POA: Diagnosis not present

## 2017-11-08 DIAGNOSIS — O09299 Supervision of pregnancy with other poor reproductive or obstetric history, unspecified trimester: Secondary | ICD-10-CM

## 2017-11-08 DIAGNOSIS — Z3480 Encounter for supervision of other normal pregnancy, unspecified trimester: Secondary | ICD-10-CM

## 2017-11-09 ENCOUNTER — Telehealth: Payer: Self-pay

## 2017-11-09 NOTE — Telephone Encounter (Signed)
Spoke with patient about her intermittent leave. She states that she she needs this starting from her last appt through her EDD. 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 Form faxed back

## 2017-11-10 ENCOUNTER — Inpatient Hospital Stay (HOSPITAL_COMMUNITY)
Admission: AD | Admit: 2017-11-10 | Discharge: 2017-11-10 | Disposition: A | Discharge status: Home / Self Care | Payer: Medicaid Other | Source: Ambulatory Visit | Attending: Obstetrics & Gynecology | Admitting: Obstetrics & Gynecology

## 2017-11-10 DIAGNOSIS — O9989 Other specified diseases and conditions complicating pregnancy, childbirth and the puerperium: Secondary | ICD-10-CM

## 2017-11-10 DIAGNOSIS — K869 Disease of pancreas, unspecified: Secondary | ICD-10-CM

## 2017-11-10 DIAGNOSIS — K8689 Other specified diseases of pancreas: Secondary | ICD-10-CM

## 2017-11-10 DIAGNOSIS — Z3A33 33 weeks gestation of pregnancy: Secondary | ICD-10-CM | POA: Diagnosis not present

## 2017-11-10 NOTE — MAU Note (Signed)
Pt reports her doctor told her when she saw her last week that she needed a follow up u/s of her pancreas

## 2017-11-10 NOTE — MAU Provider Note (Signed)
[name] is a 27 y.o. G2P1001 at 564w1d who presents to MAU today for US to evaluate pancreatic mass. She states she was told to show up anytime to have the US. No complaints today. Good FM.  BP 115/70 (BP Location: Right Arm)   Pulse 91   Temp 98.3 F (36.8 C) (Oral)   Resp 15   LMP 03/23/2017 (Exact Date)   SpO2 100%   CONSTITUTIONAL: Well-developed, well-nourished female in no acute distress.  MUSCULOSKELETAL: Normal range of motion.  CARDIOVASCULAR: Regular heart rate RESPIRATORY: Normal effort NEUROLOGICAL: Alert and oriented to person, place, and time.  SKIN: No pallor. PSYCH: Normal mood and affect. Normal behavior. Normal judgment and thought content.  No results found for this or any previous visit (from the past 24 hour(s)).  MDM Review of her chart shows a pancreatic mass that was found on CT recently and the provider recommended US for further evaluation. This would be appropriate to arrange as outpt since she has no acute complaints at this time.  A: [redacted] weeks gestation Pancreatic mass  P: Discharge home Message sent to Dr. Earlene Plateravis and clinical pool to order and schedule US Patient may return to MAU prn   Donette LarryBhambri, Jerrilyn Messinger, [place]  11/10/2017 5:20 PM

## 2017-11-14 ENCOUNTER — Telehealth: Payer: Self-pay | Admitting: *Deleted

## 2017-11-14 NOTE — Telephone Encounter (Signed)
Pt called to office stating she and other family members have been having diarrhea.  Pt states she has had at least 3 episodes today. Pt would like to know if she should be worried. Pt made aware that most likely GI virus since multiple family members with same problem. Pt made aware to monitor symptoms, stay well hydrated, eat bland/brat diet and make office aware if she is unable to tolerate any food. Pt advised to be aware if she was to start ctx. Pt made aware to stay well hydrated in order to prevent possible dehydration and ctx. [facility] or any signs of dehydration/ctx. Pt has appt in office tomorrow.  Pt advised to call office if she is still symptomatic in order to reschedule.  Pt states understanding.

## 2017-11-15 ENCOUNTER — Ambulatory Visit (INDEPENDENT_AMBULATORY_CARE_PROVIDER_SITE_OTHER): Payer: Medicaid Other | Admitting: Obstetrics and Gynecology

## 2017-11-15 ENCOUNTER — Encounter: Payer: Self-pay | Admitting: Obstetrics and Gynecology

## 2017-11-15 VITALS — BP 103/64 | HR 93 | Wt 159.7 lb

## 2017-11-15 DIAGNOSIS — K8689 Other specified diseases of pancreas: Secondary | ICD-10-CM

## 2017-11-15 DIAGNOSIS — K869 Disease of pancreas, unspecified: Secondary | ICD-10-CM

## 2017-11-15 DIAGNOSIS — O09293 Supervision of pregnancy with other poor reproductive or obstetric history, third trimester: Secondary | ICD-10-CM

## 2017-11-15 DIAGNOSIS — Z3480 Encounter for supervision of other normal pregnancy, unspecified trimester: Secondary | ICD-10-CM

## 2017-11-15 DIAGNOSIS — O09299 Supervision of pregnancy with other poor reproductive or obstetric history, unspecified trimester: Secondary | ICD-10-CM

## 2017-11-15 DIAGNOSIS — Z3483 Encounter for supervision of other normal pregnancy, third trimester: Secondary | ICD-10-CM

## 2017-11-15 MED ORDER — DOCUSATE SODIUM 100 MG PO CAPS: 100.0000 mg | ORAL_CAPSULE | Freq: Two times a day (BID) | ORAL | 2 refills | Status: DC

## 2017-11-15 NOTE — Patient Instructions (Signed)
For colds and allergies  Any anti-histamine including benadryl, allegra, claritin, etc.  Sudafed but not phenylephrine  Mucinex  Robitussin  For Reflux/heartburn  Pepcid Zantac Tums Prilosec Prevacid  For yeast infections  Monistat  For constipation  Colace  For minor aches and pains  Tylenol-do not take more than 4000mg in 24 hours. Therma-care or like heat packs  

## 2017-11-15 NOTE — Progress Notes (Signed)
   PRENATAL VISIT NOTE  Subjective:  Beth Morrow is a 27 y.o. G2P1Gwynneth Aliment001 at 6227w6d being seen today for ongoing prenatal care.  She is currently monitored for the following issues for this high-risk pregnancy and has Dental abscess; Encounter for supervision of normal pregnancy, antepartum; IUGR (intrauterine growth restriction) in prior pregnancy, pregnant; and Pancreatic mass on their problem list.  Patient reports a bout of stomach flu and had diarrhea for a few days, now having hemorrhoids that are painful.  Contractions: Not present. Vag. Bleeding: None.  Movement: Present. Denies leaking of fluid.   The following portions of the patient's history were reviewed and updated as appropriate: allergies, current medications, past family history, past medical history, past social history, past surgical history and problem list. Problem list updated.  Objective:   Vitals:   11/15/17 1602  BP: 103/64  Pulse: 93  Weight: 159 lb 11.2 oz (72.4 kg)    Fetal Status: Fetal Heart Rate (bpm): 150   Movement: Present     General:  Alert, oriented and cooperative. Patient is in no acute distress.  Skin: Skin is warm and dry. No rash noted.   Cardiovascular: Normal heart rate noted  Respiratory: Normal respiratory effort, no problems with respiration noted  Abdomen: Soft, gravid, appropriate for gestational age.  Pain/Pressure: Absent     Pelvic: Cervical exam deferred        Extremities: Normal range of motion.  Edema: None  Mental Status: Normal mood and affect. Normal behavior. Normal judgment and thought content.   Assessment and Plan:  Pregnancy: G2P1001 at 4727w6d  1. Supervision of other normal pregnancy, antepartum  2. IUGR (intrauterine growth restriction) in prior pregnancy, pregnant Last growth normal Repeat growth ordered today per MFM  3. Pancreatic mass For abdominal US, scheduled for Monday  4. Hemorrhoids - reports hemorrhoids have come back and are painful - they are  soft - start colace, will see how they do and refer to gen surg if necessary  Preterm labor symptoms and general obstetric precautions including but not limited to vaginal bleeding, contractions, leaking of fluid and fetal movement were reviewed in detail with the patient. Please refer to After Visit Summary for other counseling recommendations.  Return in about 1 week (around 11/22/2017) for OB visit (MD).  Future Appointments  Date Time Provider Department Center  11/23/2017  9:00 AM [facility] 1 [facility] 203  11/29/2017  4:00 PM Hermina StaggersErvin, Michael L, MD [facility] None    Conan BowensKelly M Jess Sulak, MD

## 2017-11-20 ENCOUNTER — Ambulatory Visit (HOSPITAL_COMMUNITY): Payer: Medicaid Other

## 2017-11-21 ENCOUNTER — Ambulatory Visit (INDEPENDENT_AMBULATORY_CARE_PROVIDER_SITE_OTHER): Payer: Medicaid Other | Admitting: Obstetrics & Gynecology

## 2017-11-21 DIAGNOSIS — Z3481 Encounter for supervision of other normal pregnancy, first trimester: Secondary | ICD-10-CM

## 2017-11-21 DIAGNOSIS — Z3483 Encounter for supervision of other normal pregnancy, third trimester: Secondary | ICD-10-CM

## 2017-11-21 DIAGNOSIS — Z3480 Encounter for supervision of other normal pregnancy, unspecified trimester: Secondary | ICD-10-CM

## 2017-11-21 MED ORDER — COMFORT FIT MATERNITY SUPP LG MISC: 1.0000 [IU] | Freq: Every day | 0 refills | Status: DC

## 2017-11-21 NOTE — Progress Notes (Signed)
   PRENATAL VISIT NOTE  Subjective:  Beth Morrow is a 27 y.o. G2P1001 at 4736w5d being seen today for ongoing prenatal care.  She is currently monitored for the following issues for this low-risk pregnancy and has Dental abscess; Encounter for supervision of normal pregnancy, antepartum; IUGR (intrauterine growth restriction) in prior pregnancy, pregnant; and Pancreatic mass on their problem list.  Patient reports backache and pressure.  Contractions: Not present. Vag. Bleeding: None.  Movement: Present. Denies leaking of fluid.   The following portions of the patient's history were reviewed and updated as appropriate: allergies, current medications, past family history, past medical history, past social history, past surgical history and problem list. Problem list updated.  Objective:   Vitals:   11/21/17 1359  BP: 100/65  Pulse: 78  Weight: 160 lb 12.8 oz (72.9 kg)    Fetal Status: Fetal Heart Rate (bpm): 151   Movement: Present     General:  Alert, oriented and cooperative. Patient is in no acute distress.  Skin: Skin is warm and dry. No rash noted.   Cardiovascular: Normal heart rate noted  Respiratory: Normal respiratory effort, no problems with respiration noted  Abdomen: Soft, gravid, appropriate for gestational age.  Pain/Pressure: Present     Pelvic: Cervical exam deferred        Extremities: Normal range of motion.  Edema: None  Mental Status: Normal mood and affect. Normal behavior. Normal judgment and thought content.   Assessment and Plan:  Pregnancy: G2P1001 at 5436w5d  1. Supervision of other normal pregnancy, antepartum Discomforts of pregnancy  Preterm labor symptoms and general obstetric precautions including but not limited to vaginal bleeding, contractions, leaking of fluid and fetal movement were reviewed in detail with the patient. Please refer to After Visit Summary for other counseling recommendations.  Return in about 2 weeks (around  12/05/2017). Support belt Future Appointments  Date Time Provider Department Center  11/23/2017  9:00 AM [facility] 1 [facility] 203  12/15/2017  3:45 PM [facility] US 2 [facility]    Scheryl DarterJames Arnold, MD

## 2017-11-21 NOTE — Progress Notes (Signed)
ROB c/o lots of pressure, [facility]

## 2017-11-21 NOTE — Patient Instructions (Signed)

## 2017-11-22 ENCOUNTER — Encounter: Payer: Self-pay | Admitting: Obstetrics and Gynecology

## 2017-11-23 ENCOUNTER — Ambulatory Visit (HOSPITAL_COMMUNITY)
Admission: RE | Admit: 2017-11-23 | Discharge: 2017-11-23 | Disposition: A | Discharge status: Home / Self Care | Payer: Medicaid Other | Source: Ambulatory Visit | Attending: Obstetrics and Gynecology | Admitting: Obstetrics and Gynecology

## 2017-11-23 DIAGNOSIS — K862 Cyst of pancreas: Secondary | ICD-10-CM | POA: Insufficient documentation

## 2017-11-23 DIAGNOSIS — Z3A29 29 weeks gestation of pregnancy: Secondary | ICD-10-CM | POA: Insufficient documentation

## 2017-11-23 DIAGNOSIS — O26893 Other specified pregnancy related conditions, third trimester: Secondary | ICD-10-CM | POA: Insufficient documentation

## 2017-11-29 ENCOUNTER — Encounter: Payer: Medicaid Other | Admitting: Obstetrics and Gynecology

## 2017-11-30 ENCOUNTER — Encounter: Payer: Self-pay | Admitting: *Deleted

## 2017-12-05 ENCOUNTER — Encounter: Payer: Self-pay | Admitting: Obstetrics and Gynecology

## 2017-12-05 ENCOUNTER — Other Ambulatory Visit (HOSPITAL_COMMUNITY)
Admission: RE | Admit: 2017-12-05 | Discharge: 2017-12-05 | Disposition: A | Discharge status: Home / Self Care | Payer: Medicaid Other | Source: Ambulatory Visit | Attending: Obstetrics and Gynecology | Admitting: Obstetrics and Gynecology

## 2017-12-05 ENCOUNTER — Ambulatory Visit (INDEPENDENT_AMBULATORY_CARE_PROVIDER_SITE_OTHER): Payer: Medicaid Other | Admitting: Obstetrics and Gynecology

## 2017-12-05 VITALS — BP 108/70 | HR 82 | Wt 165.7 lb

## 2017-12-05 DIAGNOSIS — Z3483 Encounter for supervision of other normal pregnancy, third trimester: Secondary | ICD-10-CM | POA: Insufficient documentation

## 2017-12-05 DIAGNOSIS — K869 Disease of pancreas, unspecified: Secondary | ICD-10-CM

## 2017-12-05 DIAGNOSIS — O09299 Supervision of pregnancy with other poor reproductive or obstetric history, unspecified trimester: Secondary | ICD-10-CM

## 2017-12-05 DIAGNOSIS — Z3480 Encounter for supervision of other normal pregnancy, unspecified trimester: Secondary | ICD-10-CM

## 2017-12-05 DIAGNOSIS — K8689 Other specified diseases of pancreas: Secondary | ICD-10-CM

## 2017-12-05 NOTE — Patient Instructions (Signed)
Vaginal Delivery Vaginal delivery means that you will give birth by pushing your baby out of your birth canal (vagina). A team of health care providers will help you before, during, and after vaginal delivery. Birth experiences are unique for every woman and every pregnancy, and birth experiences vary depending on where you choose to give birth. What should I do to prepare for my baby's birth? Before your baby is born, it is important to talk with your health care provider about:  Your labor and delivery preferences. These may include: ? Medicines that you may be given. ? How you will manage your pain. This might include non-medical pain relief techniques or injectable pain relief such as epidural analgesia. ? How you and your baby will be monitored during labor and delivery. ? Who may be in the labor and delivery room with you. ? Your feelings about surgical delivery of your baby (cesarean delivery, or C-section) if this becomes necessary. ? Your feelings about receiving donated blood through an IV tube (blood transfusion) if this becomes necessary.  Whether you are able: ? To take pictures or videos of the birth. ? To eat during labor and delivery. ? To move around, walk, or change positions during labor and delivery.  What to expect after your baby is born, such as: ? Whether delayed umbilical cord clamping and cutting is offered. ? Who will care for your baby right after birth. ? Medicines or tests that may be recommended for your baby. ? Whether breastfeeding is supported in your hospital or birth center. ? How long you will be in the hospital or birth center.  How any medical conditions you have may affect your baby or your labor and delivery experience.  To prepare for your baby's birth, you should also:  Attend all of your health care visits before delivery (prenatal visits) as recommended by your health care provider. This is important.  Prepare your home for your baby's  arrival. Make sure that you have: ? Diapers. ? Baby clothing. ? Feeding equipment. ? Safe sleeping arrangements for you and your baby.  Install a car seat in your vehicle. Have your car seat checked by a certified car seat installer to make sure that it is installed safely.  Think about who will help you with your new baby at home for at least the first several weeks after delivery.  What can I expect when I arrive at the birth center or hospital? Once you are in labor and have been admitted into the hospital or birth center, your health care provider may:  Review your pregnancy history and any concerns you have.  Insert an IV tube into one of your veins. This is used to give you fluids and medicines.  Check your blood pressure, pulse, temperature, and heart rate (vital signs).  Check whether your bag of water (amniotic sac) has broken (ruptured).  Talk with you about your birth plan and discuss pain control options.  Monitoring Your health care provider may monitor your contractions (uterine monitoring) and your baby's heart rate (fetal monitoring). You may need to be monitored:  Often, but not continuously (intermittently).  All the time or for long periods at a time (continuously). Continuous monitoring may be needed if: ? You are taking certain medicines, such as medicine to relieve pain or make your contractions stronger. ? You have pregnancy or labor complications.  Monitoring may be done by:  Placing a special stethoscope or a handheld monitoring device on your abdomen to   check your baby's heartbeat, and feeling your abdomen for contractions. This method of monitoring does not continuously record your baby's heartbeat or your contractions.  Placing monitors on your abdomen (external monitors) to record your baby's heartbeat and the frequency and length of contractions. You may not have to wear external monitors all the time.  Placing monitors inside of your uterus  (internal monitors) to record your baby's heartbeat and the frequency, length, and strength of your contractions. ? Your health care provider may use internal monitors if he or she needs more information about the strength of your contractions or your baby's heart rate. ? Internal monitors are put in place by passing a thin, flexible wire through your vagina and into your uterus. Depending on the type of monitor, it may remain in your uterus or on your baby's head until birth. ? Your health care provider will discuss the benefits and risks of internal monitoring with you and will ask for your permission before inserting the monitors.  Telemetry. This is a type of continuous monitoring that can be done with external or internal monitors. Instead of having to stay in bed, you are able to move around during telemetry. Ask your health care provider if telemetry is an option for you.  Physical exam Your health care provider may perform a physical exam. This may include:  Checking whether your baby is positioned: ? With the head toward your vagina (head-down). This is most common. ? With the head toward the top of your uterus (head-up or breech). If your baby is in a breech position, your health care provider may try to turn your baby to a head-down position so you can deliver vaginally. If it does not seem that your baby can be born vaginally, your provider may recommend surgery to deliver your baby. In rare cases, you may be able to deliver vaginally if your baby is head-up (breech delivery). ? Lying sideways (transverse). Babies that are lying sideways cannot be delivered vaginally.  Checking your cervix to determine: ? Whether it is thinning out (effacing). ? Whether it is opening up (dilating). ? How low your baby has moved into your birth canal.  What are the three stages of labor and delivery?  Normal labor and delivery is divided into the following three stages: Stage 1  Stage 1 is the  longest stage of labor, and it can last for hours or days. Stage 1 includes: ? Early labor. This is when contractions may be irregular, or regular and mild. Generally, early labor contractions are more than 10 minutes apart. ? Active labor. This is when contractions get longer, more regular, more frequent, and more intense. ? The transition phase. This is when contractions happen very close together, are very intense, and may last longer than during any other part of labor.  Contractions generally feel mild, infrequent, and irregular at first. They get stronger, more frequent (about every 2-3 minutes), and more regular as you progress from early labor through active labor and transition.  Many women progress through stage 1 naturally, but you may need help to continue making progress. If this happens, your health care provider may talk with you about: ? Rupturing your amniotic sac if it has not ruptured yet. ? Giving you medicine to help make your contractions stronger and more frequent.  Stage 1 ends when your cervix is completely dilated to 4 inches (10 cm) and completely effaced. This happens at the end of the transition phase. Stage 2  Once   your cervix is completely effaced and dilated to 4 inches (10 cm), you may start to feel an urge to push. It is common for the body to naturally take a rest before feeling the urge to push, especially if you received an epidural or certain other pain medicines. This rest period may last for up to 1-2 hours, depending on your unique labor experience.  During stage 2, contractions are generally less painful, because pushing helps relieve contraction pain. Instead of contraction pain, you may feel stretching and burning pain, especially when the widest part of your baby's head passes through the vaginal opening (crowning).  Your health care provider will closely monitor your pushing progress and your baby's progress through the vagina during stage 2.  Your  health care provider may massage the area of skin between your vaginal opening and anus (perineum) or apply warm compresses to your perineum. This helps it stretch as the baby's head starts to crown, which can help prevent perineal tearing. ? In some cases, an incision may be made in your perineum (episiotomy) to allow the baby to pass through the vaginal opening. An episiotomy helps to make the opening of the vagina larger to allow more room for the baby to fit through.  It is very important to breathe and focus so your health care provider can control the delivery of your baby's head. Your health care provider may have you decrease the intensity of your pushing, to help prevent perineal tearing.  After delivery of your baby's head, the shoulders and the rest of the body generally deliver very quickly and without difficulty.  Once your baby is delivered, the umbilical cord may be cut right away, or this may be delayed for 1-2 minutes, depending on your baby's health. This may vary among health care providers, hospitals, and birth centers.  If you and your baby are healthy enough, your baby may be placed on your chest or abdomen to help maintain the baby's temperature and to help you bond with each other. Some mothers and babies start breastfeeding at this time. Your health care team will dry your baby and help keep your baby warm during this time.  Your baby may need immediate care if he or she: ? Showed signs of distress during labor. ? Has a medical condition. ? Was born too early (prematurely). ? Had a bowel movement before birth (meconium). ? Shows signs of difficulty transitioning from being inside the uterus to being outside of the uterus. If you are planning to breastfeed, your health care team will help you begin a feeding. Stage 3  The third stage of labor starts immediately after the birth of your baby and ends after you deliver the placenta. The placenta is an organ that develops  during pregnancy to provide oxygen and nutrients to your baby in the womb.  Delivering the placenta may require some pushing, and you may have mild contractions. Breastfeeding can stimulate contractions to help you deliver the placenta.  After the placenta is delivered, your uterus should tighten (contract) and become firm. This helps to stop bleeding in your uterus. To help your uterus contract and to control bleeding, your health care provider may: ? Give you medicine by injection, through an IV tube, by mouth, or through your rectum (rectally). ? Massage your abdomen or perform a vaginal exam to remove any blood clots that are left in your uterus. ? Empty your bladder by placing a thin, flexible tube (catheter) into your bladder. ? Encourage   you to breastfeed your baby. After labor is over, you and your baby will be monitored closely to ensure that you are both healthy until you are ready to go home. Your health care team will teach you how to care for yourself and your baby. This information is not intended to replace advice given to you by your health care provider. Make sure you discuss any questions you have with your health care provider. Document Released: 03/01/2008 Document Revised: 12/11/2015 Document Reviewed: 06/07/2015 Elsevier Interactive Patient Education  2018 [employer]  

## 2017-12-05 NOTE — Progress Notes (Signed)
Subjective:  Beth Morrow is a 27 y.o. G2P1001 at 8751w5d being seen today for ongoing prenatal care.  She is currently monitored for the following issues for this high-risk pregnancy and has Dental abscess; Encounter for supervision of normal pregnancy, antepartum; IUGR (intrauterine growth restriction) in prior pregnancy, pregnant; and Pancreatic mass on their problem list.  Patient reports no complaints.  Contractions: Not present. Vag. Bleeding: None.  Movement: Present. Denies leaking of fluid.   The following portions of the patient's history were reviewed and updated as appropriate: allergies, current medications, past family history, past medical history, past social history, past surgical history and problem list. Problem list updated.  Objective:   Vitals:   12/05/17 1555  BP: 108/70  Pulse: 82  Weight: 165 lb 11.2 oz (75.2 kg)    Fetal Status: Fetal Heart Rate (bpm): 145   Movement: Present     General:  Alert, oriented and cooperative. Patient is in no acute distress.  Skin: Skin is warm and dry. No rash noted.   Cardiovascular: Normal heart rate noted  Respiratory: Normal respiratory effort, no problems with respiration noted  Abdomen: Soft, gravid, appropriate for gestational age. Pain/Pressure: Present     Pelvic:  Cervical exam performed        Extremities: Normal range of motion.  Edema: None  Mental Status: Normal mood and affect. Normal behavior. Normal judgment and thought content.   Urinalysis:      Assessment and Plan:  Pregnancy: G2P1001 at 7051w5d  1. Supervision of other normal pregnancy, antepartum Labor precautions - Strep Gp B NAA - Cervicovaginal ancillary only  2. IUGR (intrauterine growth restriction) in prior pregnancy, pregnant U/S 11/08/17 EFW 54 % Repeat ordered for next week  3. Pancreatic mass U/S completed unable to see pancrease d/t pregnancy Will need reevaluation after delivery  Term labor symptoms and general obstetric precautions  including but not limited to vaginal bleeding, contractions, leaking of fluid and fetal movement were reviewed in detail with the patient. Please refer to After Visit Summary for other counseling recommendations.  Return in about 1 week (around 12/12/2017) for OB visit.   Hermina StaggersErvin, Michael L, MD

## 2017-12-05 NOTE — Progress Notes (Signed)
Patient reports good fetal movement with some pressure. 

## 2017-12-06 LAB — CERVICOVAGINAL ANCILLARY ONLY
CHLAMYDIA, DNA PROBE: NEGATIVE
NEISSERIA GONORRHEA: NEGATIVE

## 2017-12-07 LAB — STREP GP B NAA: STREP GROUP B AG: NEGATIVE

## 2017-12-12 ENCOUNTER — Encounter: Payer: Self-pay | Admitting: Obstetrics

## 2017-12-12 ENCOUNTER — Ambulatory Visit (INDEPENDENT_AMBULATORY_CARE_PROVIDER_SITE_OTHER): Payer: Medicaid Other | Admitting: Obstetrics

## 2017-12-12 VITALS — BP 107/68 | HR 83 | Wt 161.8 lb

## 2017-12-12 DIAGNOSIS — O09299 Supervision of pregnancy with other poor reproductive or obstetric history, unspecified trimester: Secondary | ICD-10-CM

## 2017-12-12 DIAGNOSIS — Z3483 Encounter for supervision of other normal pregnancy, third trimester: Secondary | ICD-10-CM

## 2017-12-12 DIAGNOSIS — K862 Cyst of pancreas: Secondary | ICD-10-CM

## 2017-12-12 DIAGNOSIS — O09293 Supervision of pregnancy with other poor reproductive or obstetric history, third trimester: Secondary | ICD-10-CM

## 2017-12-12 DIAGNOSIS — Z3480 Encounter for supervision of other normal pregnancy, unspecified trimester: Secondary | ICD-10-CM

## 2017-12-12 NOTE — Progress Notes (Signed)
Subjective:  Beth Morrow is a 27 y.o. G2P1001 at 5065w5d being seen today for ongoing prenatal care.  She is currently monitored for the following issues for this high-risk pregnancy and has Dental abscess; Encounter for supervision of normal pregnancy, antepartum; IUGR (intrauterine growth restriction) in prior pregnancy, pregnant; and Pancreatic mass on their problem list.  Patient reports occasional contractions.  Contractions: Not present. Vag. Bleeding: None.  Movement: Present. Denies leaking of fluid.   The following portions of the patient's history were reviewed and updated as appropriate: allergies, current medications, past family history, past medical history, past social history, past surgical history and problem list. Problem list updated.  Objective:   Vitals:   12/12/17 1420  BP: 107/68  Pulse: 83  Weight: 161 lb 12.8 oz (73.4 kg)    Fetal Status: Fetal Heart Rate (bpm): 150   Movement: Present     General:  Alert, oriented and cooperative. Patient is in no acute distress.  Skin: Skin is warm and dry. No rash noted.   Cardiovascular: Normal heart rate noted  Respiratory: Normal respiratory effort, no problems with respiration noted  Abdomen: Soft, gravid, appropriate for gestational age. Pain/Pressure: Present     Pelvic:  Cervical exam deferred        Extremities: Normal range of motion.  Edema: None  Mental Status: Normal mood and affect. Normal behavior. Normal judgment and thought content.   Urinalysis:      Assessment and Plan:  Pregnancy: G2P1001 at 7165w5d  1. Supervision of other normal pregnancy, antepartum  2. IUGR (intrauterine growth restriction) in prior pregnancy, pregnant - normal growth percentile at 32 weeks  3. Cyst of pancreas - stable  Term labor symptoms and general obstetric precautions including but not limited to vaginal bleeding, contractions, leaking of fluid and fetal movement were reviewed in detail with the patient. Please refer  to After Visit Summary for other counseling recommendations.  Return in about 1 week (around 12/19/2017) for ROB.   Brock BadHarper, Surah Pelley A, MD

## 2017-12-12 NOTE — Progress Notes (Signed)
Patient reports good fetal movement with pressure, denies contractions. 

## 2017-12-14 ENCOUNTER — Encounter: Payer: Medicaid Other | Admitting: Obstetrics and Gynecology

## 2017-12-15 ENCOUNTER — Ambulatory Visit (HOSPITAL_COMMUNITY)
Admission: RE | Admit: 2017-12-15 | Discharge: 2017-12-15 | Disposition: A | Discharge status: Home / Self Care | Payer: Medicaid Other | Source: Ambulatory Visit | Attending: Obstetrics and Gynecology | Admitting: Obstetrics and Gynecology

## 2017-12-15 ENCOUNTER — Other Ambulatory Visit: Payer: Self-pay | Admitting: Obstetrics and Gynecology

## 2017-12-15 DIAGNOSIS — Z3A38 38 weeks gestation of pregnancy: Secondary | ICD-10-CM

## 2017-12-15 DIAGNOSIS — O09299 Supervision of pregnancy with other poor reproductive or obstetric history, unspecified trimester: Secondary | ICD-10-CM

## 2017-12-15 DIAGNOSIS — O09293 Supervision of pregnancy with other poor reproductive or obstetric history, third trimester: Secondary | ICD-10-CM | POA: Insufficient documentation

## 2017-12-18 ENCOUNTER — Other Ambulatory Visit (HOSPITAL_COMMUNITY): Payer: Self-pay | Admitting: *Deleted

## 2017-12-19 ENCOUNTER — Ambulatory Visit (INDEPENDENT_AMBULATORY_CARE_PROVIDER_SITE_OTHER): Payer: Medicaid Other | Admitting: Medical

## 2017-12-19 ENCOUNTER — Encounter: Payer: Self-pay | Admitting: Medical

## 2017-12-19 VITALS — BP 119/68 | HR 73 | Wt 162.0 lb

## 2017-12-19 DIAGNOSIS — Z3480 Encounter for supervision of other normal pregnancy, unspecified trimester: Secondary | ICD-10-CM

## 2017-12-19 DIAGNOSIS — O09299 Supervision of pregnancy with other poor reproductive or obstetric history, unspecified trimester: Secondary | ICD-10-CM

## 2017-12-19 NOTE — Progress Notes (Signed)
Patient reports good fetal movement with pressure. 

## 2017-12-19 NOTE — Progress Notes (Signed)
   PRENATAL VISIT NOTE  Subjective:  Beth Morrow is a 27 y.o. G2P1001 at 2272w5d being seen today for ongoing prenatal care.  She is currently monitored for the following issues for this low-risk pregnancy and has Dental abscess; Encounter for supervision of normal pregnancy, antepartum; IUGR (intrauterine growth restriction) in prior pregnancy, pregnant; and Pancreatic mass on their problem list.  Patient reports no complaints.  Contractions: Not present. Vag. Bleeding: None.  Movement: Present. Denies leaking of fluid.   The following portions of the patient's history were reviewed and updated as appropriate: allergies, current medications, past family history, past medical history, past social history, past surgical history and problem list. Problem list updated.  Objective:   Vitals:   12/19/17 1528  BP: 119/68  Pulse: 73  Weight: 162 lb (73.5 kg)    Fetal Status: Fetal Heart Rate (bpm): 148 Fundal Height: 38 cm Movement: Present  Presentation: Vertex  General:  Alert, oriented and cooperative. Patient is in no acute distress.  Skin: Skin is warm and dry. No rash noted.   Cardiovascular: Normal heart rate noted  Respiratory: Normal respiratory effort, no problems with respiration noted  Abdomen: Soft, gravid, appropriate for gestational age.  Pain/Pressure: Present     Pelvic: Cervical exam performed Dilation: 1.5 Effacement (%): 50 Station: -3  Extremities: Normal range of motion.  Edema: None  Mental Status: Normal mood and affect. Normal behavior. Normal judgment and thought content.   Assessment and Plan:  Pregnancy: G2P1001 at 8672w5d  1. Supervision of other normal pregnancy, antepartum - Doing well, only occasional contractions  2. IUGR (intrauterine growth restriction) in prior pregnancy, pregnant - Last US showed normal interval growth. No further FU US recommended. Deliver between 40-41 weeks per MFM  Term labor symptoms and general obstetric precautions  including but not limited to vaginal bleeding, contractions, leaking of fluid and fetal movement were reviewed in detail with the patient. Please refer to After Visit Summary for other counseling recommendations.  Return in about 1 week (around 12/26/2017) for LOB. Will consider scheduling IOL at next visit if needed.   Vonzella NippleJulie Rosamae Rocque, PA-C

## 2017-12-19 NOTE — Patient Instructions (Addendum)
Research childbirth classes and hospital preregistration at [email or web address]  Fetal Movement Counts Patient Name: ________________________________________________ Patient Due Date: ____________________ What is a fetal movement count? A fetal movement count is the number of times that you feel your baby move during a certain amount of time. This may also be called a fetal kick count. A fetal movement count is recommended for every pregnant woman. You may be asked to start counting fetal movements as early as week 28 of your pregnancy. Pay attention to when your baby is most active. You may notice your baby's sleep and wake cycles. You may also notice things that make your baby move more. You should do a fetal movement count:  When your baby is normally most active.  At the same time each day.  A good time to count movements is while you are resting, after having something to eat and drink. How do I count fetal movements? 1. Find a quiet, comfortable area. Sit, or lie down on your side. 2. Write down the date, the start time and stop time, and the number of movements that you felt between those two times. Take this information with you to your health care visits. 3. For 2 hours, count kicks, flutters, swishes, rolls, and jabs. You should feel at least 10 movements during 2 hours. 4. You may stop counting after you have felt 10 movements. 5. If you do not feel 10 movements in 2 hours, have something to eat and drink. Then, keep resting and counting for 1 hour. If you feel at least 4 movements during that hour, you may stop counting. Contact a health care provider if:  You feel fewer than 4 movements in 2 hours.  Your baby is not moving like he or she usually does. Date: ____________ Start time: ____________ Stop time: ____________ Movements: ____________ Date: ____________ Start time: ____________ Stop time: ____________ Movements: ____________ Date: ____________ Start time: ____________  Stop time: ____________ Movements: ____________ Date: ____________ Start time: ____________ Stop time: ____________ Movements: ____________ Date: ____________ Start time: ____________ Stop time: ____________ Movements: ____________ Date: ____________ Start time: ____________ Stop time: ____________ Movements: ____________ Date: ____________ Start time: ____________ Stop time: ____________ Movements: ____________ Date: ____________ Start time: ____________ Stop time: ____________ Movements: ____________ Date: ____________ Start time: ____________ Stop time: ____________ Movements: ____________ This information is not intended to replace advice given to you by your health care provider. Make sure you discuss any questions you have with your health care provider. Document Released: 06/22/2006 Document Revised: 01/20/2016 Document Reviewed: 07/02/2015 Elsevier Interactive Patient Education  2018 [employer]  Braxton Hicks Contractions Contractions of the uterus can occur throughout pregnancy, but they are not always a sign that you are in labor. You may have practice contractions called Braxton Hicks contractions. These false labor contractions are sometimes confused with true labor. What are Braxton Hicks contractions? Braxton Hicks contractions are tightening movements that occur in the muscles of the uterus before labor. Unlike true labor contractions, these contractions do not result in opening (dilation) and thinning of the cervix. Toward the end of pregnancy (32-34 weeks), Braxton Hicks contractions can happen more often and may become stronger. These contractions are sometimes difficult to tell apart from true labor because they can be very uncomfortable. You should not feel embarrassed if you go to the hospital with false labor. Sometimes, the only way to tell if you are in true labor is for your health care provider to look for changes in the cervix. The health care provider will   do a physical  exam and may monitor your contractions. If you are not in true labor, the exam should show that your cervix is not dilating and your water has not broken. If there are other health problems associated with your pregnancy, it is completely safe for you to be sent home with false labor. You may continue to have Braxton Hicks contractions until you go into true labor. How to tell the difference between true labor and false labor True labor  Contractions last 30-70 seconds.  Contractions become very regular.  Discomfort is usually felt in the top of the uterus, and it spreads to the lower abdomen and low back.  Contractions do not go away with walking.  Contractions usually become more intense and increase in frequency.  The cervix dilates and gets thinner. False labor  Contractions are usually shorter and not as strong as true labor contractions.  Contractions are usually irregular.  Contractions are often felt in the front of the lower abdomen and in the groin.  Contractions may go away when you walk around or change positions while lying down.  Contractions get weaker and are shorter-lasting as time goes on.  The cervix usually does not dilate or become thin. Follow these instructions at home:  Take over-the-counter and prescription medicines only as told by your health care provider.  Keep up with your usual exercises and follow other instructions from your health care provider.  Eat and drink lightly if you think you are going into labor.  If Braxton Hicks contractions are making you uncomfortable: ? Change your position from lying down or resting to walking, or change from walking to resting. ? Sit and rest in a tub of warm water. ? Drink enough fluid to keep your urine pale yellow. Dehydration may cause these contractions. ? Do slow and deep breathing several times an hour.  Keep all follow-up prenatal visits as told by your health care provider. This is  important. Contact a health care provider if:  You have a fever.  You have continuous pain in your abdomen. Get help right away if:  Your contractions become stronger, more regular, and closer together.  You have fluid leaking or gushing from your vagina.  You pass blood-tinged mucus (bloody show).  You have bleeding from your vagina.  You have low back pain that you never had before.  You feel your baby's head pushing down and causing pelvic pressure.  Your baby is not moving inside you as much as it used to. Summary  Contractions that occur before labor are called Braxton Hicks contractions, false labor, or practice contractions.  Braxton Hicks contractions are usually shorter, weaker, farther apart, and less regular than true labor contractions. True labor contractions usually become progressively stronger and regular and they become more frequent.  Manage discomfort from Braxton Hicks contractions by changing position, resting in a warm bath, drinking plenty of water, or practicing deep breathing. This information is not intended to replace advice given to you by your health care provider. Make sure you discuss any questions you have with your health care provider. Document Released: 10/06/2016 Document Revised: 10/06/2016 Document Reviewed: 10/06/2016 Elsevier Interactive Patient Education  2018 Elsevier Inc.    

## 2017-12-21 ENCOUNTER — Encounter: Payer: Self-pay | Admitting: Obstetrics and Gynecology

## 2017-12-25 ENCOUNTER — Telehealth: Payer: Self-pay

## 2017-12-25 ENCOUNTER — Encounter: Payer: Self-pay | Admitting: General Practice

## 2017-12-25 NOTE — Telephone Encounter (Signed)
STD forms refaxed Pt made aware  I have confirmation that forms were received to [employer] I also attached Letter from Physician with confirmed date to take leave.

## 2017-12-27 ENCOUNTER — Ambulatory Visit (INDEPENDENT_AMBULATORY_CARE_PROVIDER_SITE_OTHER): Payer: Medicaid Other | Admitting: Obstetrics & Gynecology

## 2017-12-27 VITALS — BP 120/68 | HR 74 | Wt 162.9 lb

## 2017-12-27 DIAGNOSIS — Z3480 Encounter for supervision of other normal pregnancy, unspecified trimester: Secondary | ICD-10-CM

## 2017-12-27 NOTE — Progress Notes (Signed)
   PRENATAL VISIT NOTE  Subjective:  Beth Morrow is a 27 y.o. G2P1001 at 879w6d being seen today for ongoing prenatal care.  She is currently monitored for the following issues for this low-risk pregnancy and has Dental abscess; Encounter for supervision of normal pregnancy, antepartum; IUGR (intrauterine growth restriction) in prior pregnancy, pregnant; and Pancreatic mass on their problem list.  Patient reports occasional contractions.  Contractions: Irritability. Vag. Bleeding: None.  Movement: Present. Denies leaking of fluid.   The following portions of the patient's history were reviewed and updated as appropriate: allergies, current medications, past family history, past medical history, past social history, past surgical history and problem list. Problem list updated.  Objective:   Vitals:   12/27/17 1533  BP: 120/68  Pulse: 74  Weight: 162 lb 14.4 oz (73.9 kg)    Fetal Status: Fetal Heart Rate (bpm): 152   Movement: Present     General:  Alert, oriented and cooperative. Patient is in no acute distress.  Skin: Skin is warm and dry. No rash noted.   Cardiovascular: Normal heart rate noted  Respiratory: Normal respiratory effort, no problems with respiration noted  Abdomen: Soft, gravid, appropriate for gestational age.  Pain/Pressure: Present     Pelvic: Cervical exam performed        Extremities: Normal range of motion.  Edema: None  Mental Status: Normal mood and affect. Normal behavior. Normal judgment and thought content.   Assessment and Plan:  Pregnancy: G2P1001 at 3779w6d  1. Supervision of other normal pregnancy, antepartum Doing well, IOL 41 weeks prn  Term labor symptoms and general obstetric precautions including but not limited to vaginal bleeding, contractions, leaking of fluid and fetal movement were reviewed in detail with the patient. Please refer to After Visit Summary for other counseling recommendations.  Return in about 5 days (around  01/01/2018).  No future appointments.  Scheryl DarterJames Arnold, MD

## 2017-12-28 ENCOUNTER — Telehealth (HOSPITAL_COMMUNITY): Payer: Self-pay | Admitting: *Deleted

## 2017-12-28 NOTE — Telephone Encounter (Signed)
Preadmission screen  

## 2018-01-03 ENCOUNTER — Inpatient Hospital Stay (HOSPITAL_COMMUNITY)
Admission: AD | Admit: 2018-01-03 | Discharge: 2018-01-05 | DRG: 807 | Disposition: A | Discharge status: Home / Self Care | Payer: Medicaid Other | Attending: Family Medicine | Admitting: Family Medicine

## 2018-01-03 ENCOUNTER — Inpatient Hospital Stay (HOSPITAL_COMMUNITY): Payer: Medicaid Other | Admitting: Anesthesiology

## 2018-01-03 ENCOUNTER — Encounter (HOSPITAL_COMMUNITY): Payer: Self-pay | Admitting: *Deleted

## 2018-01-03 ENCOUNTER — Other Ambulatory Visit: Payer: Self-pay

## 2018-01-03 ENCOUNTER — Encounter: Payer: Medicaid Other | Admitting: Obstetrics and Gynecology

## 2018-01-03 DIAGNOSIS — K869 Disease of pancreas, unspecified: Secondary | ICD-10-CM | POA: Diagnosis present

## 2018-01-03 DIAGNOSIS — O9989 Other specified diseases and conditions complicating pregnancy, childbirth and the puerperium: Secondary | ICD-10-CM | POA: Diagnosis present

## 2018-01-03 DIAGNOSIS — O365930 Maternal care for other known or suspected poor fetal growth, third trimester, not applicable or unspecified: Secondary | ICD-10-CM | POA: Diagnosis present

## 2018-01-03 DIAGNOSIS — O480 Post-term pregnancy: Secondary | ICD-10-CM | POA: Diagnosis not present

## 2018-01-03 DIAGNOSIS — Z87891 Personal history of nicotine dependence: Secondary | ICD-10-CM | POA: Diagnosis not present

## 2018-01-03 DIAGNOSIS — K8689 Other specified diseases of pancreas: Secondary | ICD-10-CM

## 2018-01-03 DIAGNOSIS — Z3A40 40 weeks gestation of pregnancy: Secondary | ICD-10-CM | POA: Diagnosis not present

## 2018-01-03 DIAGNOSIS — Z3483 Encounter for supervision of other normal pregnancy, third trimester: Secondary | ICD-10-CM | POA: Diagnosis present

## 2018-01-03 HISTORY — DX: Other specified health status: Z78.9

## 2018-01-03 HISTORY — DX: Other specified diseases of pancreas: K86.89

## 2018-01-03 LAB — CBC
HEMATOCRIT: 36.1 % (ref 36.0–46.0)
HEMOGLOBIN: 12.5 g/dL (ref 12.0–15.0)
MCH: 32.8 pg (ref 26.0–34.0)
MCHC: 34.6 g/dL (ref 30.0–36.0)
MCV: 94.8 fL (ref 78.0–100.0)
PLATELETS: 235 10*3/uL (ref 150–400)
RBC: 3.81 MIL/uL — AB (ref 3.87–5.11)
RDW: 14.2 % (ref 11.5–15.5)
WBC: 8.1 10*3/uL (ref 4.0–10.5)

## 2018-01-03 LAB — TYPE AND SCREEN
ABO/RH(D): A POS
Antibody Screen: NEGATIVE

## 2018-01-03 LAB — ABO/RH: ABO/RH(D): A POS

## 2018-01-03 MED ORDER — LIDOCAINE HCL (PF) 1 % IJ SOLN
INTRAMUSCULAR | Status: DC | PRN
  Administered 2018-01-03: 5 mL via EPIDURAL
  Administered 2018-01-03: 4 mL via EPIDURAL

## 2018-01-03 MED ORDER — OXYCODONE-ACETAMINOPHEN 5-325 MG PO TABS: 2.0000 | ORAL_TABLET | ORAL | Status: DC | PRN

## 2018-01-03 MED ORDER — TERBUTALINE SULFATE 1 MG/ML IJ SOLN
0.2500 mg | Freq: Once | INTRAMUSCULAR | Status: DC | PRN
  Filled 2018-01-03: qty 1

## 2018-01-03 MED ORDER — OXYTOCIN 40 UNITS IN LACTATED RINGERS INFUSION - SIMPLE MED
1.0000 m[IU]/min | INTRAVENOUS | Status: DC
  Administered 2018-01-03: 2 m[IU]/min via INTRAVENOUS
  Filled 2018-01-03: qty 1000

## 2018-01-03 MED ORDER — EPHEDRINE 5 MG/ML INJ
10.0000 mg | INTRAVENOUS | Status: DC | PRN
  Filled 2018-01-03: qty 2

## 2018-01-03 MED ORDER — FENTANYL 2.5 MCG/ML BUPIVACAINE 1/10 % EPIDURAL INFUSION (WH - ANES)
14.0000 mL/h | INTRAMUSCULAR | Status: DC | PRN
  Administered 2018-01-03: 14 mL/h via EPIDURAL
  Filled 2018-01-03: qty 100

## 2018-01-03 MED ORDER — OXYCODONE-ACETAMINOPHEN 5-325 MG PO TABS: 1.0000 | ORAL_TABLET | ORAL | Status: DC | PRN

## 2018-01-03 MED ORDER — LIDOCAINE HCL (PF) 1 % IJ SOLN
30.0000 mL | INTRAMUSCULAR | Status: DC | PRN
  Filled 2018-01-03: qty 30

## 2018-01-03 MED ORDER — DIPHENHYDRAMINE HCL 50 MG/ML IJ SOLN: 12.5000 mg | INTRAMUSCULAR | Status: DC | PRN

## 2018-01-03 MED ORDER — ONDANSETRON HCL 4 MG/2ML IJ SOLN
4.0000 mg | Freq: Four times a day (QID) | INTRAMUSCULAR | Status: DC | PRN
  Administered 2018-01-03: 4 mg via INTRAVENOUS
  Filled 2018-01-03: qty 2

## 2018-01-03 MED ORDER — PHENYLEPHRINE 40 MCG/ML (10ML) SYRINGE FOR IV PUSH (FOR BLOOD PRESSURE SUPPORT)
80.0000 ug | PREFILLED_SYRINGE | INTRAVENOUS | Status: DC | PRN
  Filled 2018-01-03: qty 5
  Filled 2018-01-03: qty 10

## 2018-01-03 MED ORDER — LACTATED RINGERS IV SOLN
INTRAVENOUS | Status: DC
  Administered 2018-01-03 (×4): via INTRAVENOUS

## 2018-01-03 MED ORDER — ACETAMINOPHEN 325 MG PO TABS: 650.0000 mg | ORAL_TABLET | ORAL | Status: DC | PRN

## 2018-01-03 MED ORDER — FLEET ENEMA 7-19 GM/118ML RE ENEM: 1.0000 | ENEMA | RECTAL | Status: DC | PRN

## 2018-01-03 MED ORDER — OXYTOCIN 40 UNITS IN LACTATED RINGERS INFUSION - SIMPLE MED: 2.5000 [IU]/h | INTRAVENOUS | Status: DC

## 2018-01-03 MED ORDER — OXYTOCIN BOLUS FROM INFUSION
500.0000 mL | Freq: Once | INTRAVENOUS | Status: AC
  Administered 2018-01-04: 500 mL via INTRAVENOUS

## 2018-01-03 MED ORDER — SOD CITRATE-CITRIC ACID 500-334 MG/5ML PO SOLN: 30.0000 mL | ORAL | Status: DC | PRN

## 2018-01-03 MED ORDER — PHENYLEPHRINE 40 MCG/ML (10ML) SYRINGE FOR IV PUSH (FOR BLOOD PRESSURE SUPPORT)
80.0000 ug | PREFILLED_SYRINGE | INTRAVENOUS | Status: DC | PRN
  Filled 2018-01-03: qty 5

## 2018-01-03 MED ORDER — LACTATED RINGERS IV SOLN
500.0000 mL | INTRAVENOUS | Status: DC | PRN
Start: 2018-01-03 — End: 2018-01-04

## 2018-01-03 MED ORDER — LACTATED RINGERS IV SOLN
500.0000 mL | Freq: Once | INTRAVENOUS | Status: AC
  Administered 2018-01-03: 500 mL via INTRAVENOUS

## 2018-01-03 MED ORDER — FENTANYL CITRATE (PF) 100 MCG/2ML IJ SOLN: 100.0000 ug | INTRAMUSCULAR | Status: DC | PRN

## 2018-01-03 NOTE — Progress Notes (Signed)
Patient ID: Beth Morrow, female   DOB: 1990-07-08, 27 y.o.   MRN: [id number]  Pt feeling some vag pressure and nausea  BP 116/72, P 75 FHR 140-150s, +accels, occ mi variables Ctx q 3 mins w/ Pit at 324mu/min Cx 8/C/vtx 0; AROM for mod MSF  IUP@term  Active labor/transition  Plan to check cx in 2 hrs or sooner with urge to push Anticipate SVD  Cam HaiSHAW, Beth Morrow CNM 01/03/2018

## 2018-01-03 NOTE — Anesthesia Preprocedure Evaluation (Addendum)
Anesthesia Evaluation  Patient identified by MRN, date of birth, ID band Patient awake    Reviewed: Allergy & Precautions, NPO status , Patient's Chart, lab work & pertinent test results  History of Anesthesia Complications Negative for: history of anesthetic complications  Airway Mallampati: II  TM Distance: >3 FB Neck ROM: Full    Dental  (+) Dental Advisory Given   Pulmonary former smoker,    breath sounds clear to auscultation       Cardiovascular negative cardio ROS   Rhythm:Regular Rate:Normal     Neuro/Psych negative neurological ROS  negative psych ROS   GI/Hepatic negative GI ROS, Neg liver ROS,   Endo/Other  negative endocrine ROS  Renal/GU negative Renal ROS  negative genitourinary   Musculoskeletal negative musculoskeletal ROS (+)   Abdominal   Peds  Hematology negative hematology ROS (+)   Anesthesia Other Findings   Reproductive/Obstetrics (+) Pregnancy                            Anesthesia Physical Anesthesia Plan  ASA: II  Anesthesia Plan: Epidural   Post-op Pain Management:    Induction:   PONV Risk Score and Plan:   Airway Management Planned: Natural Airway  Additional Equipment: None  Intra-op Plan:   Post-operative Plan:   Informed Consent: I have reviewed the patients History and Physical, chart, labs and discussed the procedure including the risks, benefits and alternatives for the proposed anesthesia with the patient or authorized representative who has indicated his/her understanding and acceptance.     Plan Discussed with: Anesthesiologist  Anesthesia Plan Comments: (Labs reviewed. Platelets acceptable, patient not taking any blood thinning medications. Per RN, FHR tracing reported to be stable enough for sitting procedure. Risks and benefits discussed with patient, including PDPH, backache, failed epidural, allergic reaction, and nerve injury.  Patient expressed understanding and wished to proceed.)       Anesthesia Quick Evaluation

## 2018-01-03 NOTE — Progress Notes (Addendum)
Pt instructed she may ambulate for 1 hour then have cervix re evaluated for change.  Instructed to remain on the 1st floor and return @ 1410 or sooner if ROM or VB.  Pt verbalized understanding & agreeable.

## 2018-01-03 NOTE — Anesthesia Pain Management Evaluation Note (Signed)
  CRNA Pain Management Visit Note  Patient: Beth Morrow, 27 y.o., female  "Hello I am a member of the anesthesia team at [facility]. We have an anesthesia team available at all times to provide care throughout the hospital, including epidural management and anesthesia for C-section. I don't know your plan for the delivery whether it a natural birth, water birth, IV sedation, nitrous supplementation, doula or epidural, but we want to meet your pain goals."   1.Was your pain managed to your expectations on prior hospitalizations?   Yes   2.What is your expectation for pain management during this hospitalization?     Epidural  3.How can we help you reach that goal? Maintain epidural until delivery of infant.  Record the patient's initial score and the patient's pain goal.   Pain: 4  Pain Goal: 1 [facility] wants you to be able to say your pain was always managed very well.  Beth Morrow 01/03/2018

## 2018-01-03 NOTE — MAU Note (Addendum)
Pt presents with c/o ctxs that began @ 0830 this morning.  Denies VB or LOF.  Reports +FM. Scheduled for IOL tomorrow, foley bulb to be inserted in office @ 1415 this afternoon.

## 2018-01-03 NOTE — H&P (Signed)
LABOR AND DELIVERY ADMISSION HISTORY AND PHYSICAL NOTE  Beth Morrow is a 27 y.o. female G2P1001 with IUP at 5532w6d by LMP presenting for early labor. Reports regular contractions since this morning, getting stronger and more frequent.  She was scheduled to get a outpatient foley bulb this afternoon and induction tomorrow morning. She reports positive fetal movement. She denies leakage of fluid or vaginal bleeding.  Prenatal History/Complications: PNC at [facility] Pregnancy complications:  - H/o IUGR las pregnancy - ?Pancreatic mass noted on CT scan April 2019 - f/u U/s not able to visualize pancreas d/t pregnancy  - f/u after pregnancy indicated  Past Medical History: Past Medical History:  Diagnosis Date  . Dental abscess   . Medical history non-contributory     Past Surgical History: Past Surgical History:  Procedure Laterality Date  . NO PAST SURGERIES      Obstetrical History: OB History    Gravida  2   Para  1   Term  1   Preterm      AB      Living  1     SAB      TAB      Ectopic      Multiple      Live Births  1           Social History: Social History   Socioeconomic History  . Marital status: Single    Spouse name: Not on file  . Number of children: Not on file  . Years of education: Not on file  . Highest education level: Not on file  Occupational History  . Not on file  Social Needs  . Financial resource strain: Not on file  . Food insecurity:    Worry: Not on file    Inability: Not on file  . Transportation needs:    Medical: Not on file    Non-medical: Not on file  Tobacco Use  . Smoking status: Former Smoker    Types: Cigarettes  . Smokeless tobacco: Never Used  . Tobacco comment: Quit August 2018  Substance and Sexual Activity  . Alcohol use: No  . Drug use: No  . Sexual activity: Yes    Birth control/protection: None  Lifestyle  . Physical activity:    Days per week: Not on file    Minutes per session: Not on  file  . Stress: Not on file  Relationships  . Social connections:    Talks on phone: Not on file    Gets together: Not on file    Attends religious service: Not on file    Active member of club or organization: Not on file    Attends meetings of clubs or organizations: Not on file    Relationship status: Not on file  Other Topics Concern  . Not on file  Social History Narrative  . Not on file    Family History: Family History  Adopted: Yes    Allergies: No Known Allergies  Medications Prior to Admission  Medication Sig Dispense Refill Last Dose  . Elastic Bandages & Supports (COMFORT FIT MATERNITY SUPP LG) MISC 1 Units by Does not apply route daily. 1 each 0 Taking  . Prenatal Vit w/Fe-Methylfol-FA (PNV PO) Take by mouth.   Taking     Review of Systems  All systems reviewed and negative except as stated in HPI  Physical Exam Blood pressure 111/66, pulse 83, temperature 97.8 F (36.6 C), temperature source Oral, resp. rate 19, height 5'  2" (1.575 m), weight 74 kg (163 lb 4 oz), last menstrual period 03/23/2017, SpO2 98 %. General appearance: alert, oriented. She is breathing through contractions. Otherwise no distress Lungs: normal respiratory effort Heart: regular rate Abdomen: soft, non-tender; gravid, FH appropriate for GA Extremities: No calf swelling or tenderness Presentation: cephalic by SVE Fetal monitoring: baseline rate 145, moderate variability, +acel, no decel Uterine activity: ctx q 2-4 min SVE: Dilation: 3 Effacement (%): 50 Station: -2 Exam by:: Dr. Nira Retort  Prenatal labs: ABO, Rh: A/Positive/-- (12/14 0000) Antibody: Negative (12/14 0000) Rubella: Immune (12/14 0000) RPR: Non Reactive (05/01 1025)  HBsAg: Negative (12/14 0000)  HIV: Non Reactive (05/01 1025)  GC/Chlamydia: negative GBS: Negative (07/02 1647)  2-hr GTT: normal Genetic screening:  Negative NIPS Anatomy US: normal anatomy  Prenatal Transfer Tool  Maternal Diabetes:  No Genetic Screening: Normal Maternal Ultrasounds/Referrals: Normal Fetal Ultrasounds or other Referrals:  None Maternal Substance Abuse:  No Significant Maternal Medications:  None Significant Maternal Lab Results: None  No results found for this or any previous visit (from the past 24 hour(s)).  Patient Active Problem List   Diagnosis Date Noted  . 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  . Encounter for supervision of normal pregnancy, antepartum 06/15/2017  . IUGR (intrauterine growth restriction) in prior pregnancy, pregnant 06/15/2017  . Dental abscess     Assessment: Beth Morrow is a 27 y.o. G2P1001 at [redacted]w[redacted]d here for early labor.  #Labor: Expectant management for now. Augment if needed #Pain: Planning on epidural soon #FWB: Cat I #ID:  GBS neg #MOF: breast #MOC: Depo #Circ:  N/a (girl)  #?Pancreatic mass: incidental finding in April on Chest CT done to r/o PE, f/u U/S unable to properly visualize pancrease d/t pregnancy and f/u after pregnancy indicated. Will need abdominal U/s postpartum and possible GI consult.   Beth Morrow 01/03/2018, 3:29 PM

## 2018-01-03 NOTE — Anesthesia Procedure Notes (Signed)
Epidural Patient location during procedure: OB Start time: 01/03/2018 5:31 PM End time: 01/03/2018 5:35 PM  Staffing Anesthesiologist: Beryle LatheBrock, Thomas E, MD Performed: anesthesiologist   Preanesthetic Checklist Completed: patient identified, pre-op evaluation, timeout performed, IV checked, risks and benefits discussed and monitors and equipment checked  Epidural Patient position: sitting Prep: DuraPrep Patient monitoring: continuous pulse ox and blood pressure Approach: midline Location: L2-L3 Injection technique: LOR saline  Needle:  Needle type: Tuohy  Needle gauge: 17 G Needle length: 9 cm Needle insertion depth: 5 cm Catheter size: 19 Gauge Catheter at skin depth: 10 cm Test dose: negative and Other (1% lidocaine)  Additional Notes Patient identified. Risks including, but not limited to, bleeding, infection, nerve damage, paralysis, inadequate analgesia, blood pressure changes, nausea, vomiting, allergic reaction, postpartum back pain, itching, and headache were discussed. Patient expressed understanding and wished to proceed. Sterile prep and drape, including hand hygiene, mask, and sterile gloves were used. The patient was positioned and the spine was prepped. The skin was anesthetized with lidocaine. No paraesthesia or other complication noted. The patient did not experience any signs of intravascular injection such as tinnitus or metallic taste in mouth, nor signs of intrathecal spread such as rapid motor block. Please see nursing notes for vital signs. The patient tolerated the procedure well.   Leslye Peerhomas Brock, MDReason for block:procedure for pain

## 2018-01-03 NOTE — Progress Notes (Signed)
SVE 3.5cm/70%/-2, still posterior Ctx pattern irregular. Augment with Pitocin FHT Cat I  Kandra NicolasJulie P. Degele, MD OB Fellow

## 2018-01-04 ENCOUNTER — Encounter (HOSPITAL_COMMUNITY): Payer: Self-pay

## 2018-01-04 ENCOUNTER — Inpatient Hospital Stay (HOSPITAL_COMMUNITY): Admission: RE | Admit: 2018-01-04 | Payer: Medicaid Other | Source: Ambulatory Visit

## 2018-01-04 DIAGNOSIS — O480 Post-term pregnancy: Secondary | ICD-10-CM

## 2018-01-04 DIAGNOSIS — Z3A40 40 weeks gestation of pregnancy: Secondary | ICD-10-CM

## 2018-01-04 LAB — RPR: RPR Ser Ql: NONREACTIVE

## 2018-01-04 MED ORDER — IBUPROFEN 600 MG PO TABS
600.0000 mg | ORAL_TABLET | Freq: Four times a day (QID) | ORAL | Status: DC
  Administered 2018-01-04 – 2018-01-05 (×6): 600 mg via ORAL
  Filled 2018-01-04 (×7): qty 1

## 2018-01-04 MED ORDER — WITCH HAZEL-GLYCERIN EX PADS: 1.0000 "application " | MEDICATED_PAD | CUTANEOUS | Status: DC | PRN

## 2018-01-04 MED ORDER — SIMETHICONE 80 MG PO CHEW: 80.0000 mg | CHEWABLE_TABLET | ORAL | Status: DC | PRN

## 2018-01-04 MED ORDER — ONDANSETRON HCL 4 MG/2ML IJ SOLN: 4.0000 mg | INTRAMUSCULAR | Status: DC | PRN

## 2018-01-04 MED ORDER — OXYCODONE HCL 5 MG PO TABS
5.0000 mg | ORAL_TABLET | ORAL | Status: DC | PRN
  Administered 2018-01-04: 5 mg via ORAL
  Filled 2018-01-04: qty 1

## 2018-01-04 MED ORDER — PRENATAL MULTIVITAMIN CH
1.0000 | ORAL_TABLET | Freq: Every day | ORAL | Status: DC
  Administered 2018-01-04 – 2018-01-05 (×2): 1 via ORAL
  Filled 2018-01-04 (×2): qty 1

## 2018-01-04 MED ORDER — DIBUCAINE 1 % RE OINT: 1.0000 "application " | TOPICAL_OINTMENT | RECTAL | Status: DC | PRN

## 2018-01-04 MED ORDER — DIPHENHYDRAMINE HCL 25 MG PO CAPS: 25.0000 mg | ORAL_CAPSULE | Freq: Four times a day (QID) | ORAL | Status: DC | PRN

## 2018-01-04 MED ORDER — ONDANSETRON HCL 4 MG PO TABS: 4.0000 mg | ORAL_TABLET | ORAL | Status: DC | PRN

## 2018-01-04 MED ORDER — BENZOCAINE-MENTHOL 20-0.5 % EX AERO
1.0000 "application " | INHALATION_SPRAY | CUTANEOUS | Status: DC | PRN
  Administered 2018-01-04: 1 via TOPICAL
  Filled 2018-01-04: qty 56

## 2018-01-04 MED ORDER — TETANUS-DIPHTH-ACELL PERTUSSIS 5-2.5-18.5 LF-MCG/0.5 IM SUSP: 0.5000 mL | Freq: Once | INTRAMUSCULAR | Status: DC

## 2018-01-04 MED ORDER — SENNOSIDES-DOCUSATE SODIUM 8.6-50 MG PO TABS
2.0000 | ORAL_TABLET | ORAL | Status: DC
  Administered 2018-01-04: 2 via ORAL
  Filled 2018-01-04: qty 2

## 2018-01-04 MED ORDER — ACETAMINOPHEN 325 MG PO TABS: 650.0000 mg | ORAL_TABLET | ORAL | Status: DC | PRN

## 2018-01-04 MED ORDER — ZOLPIDEM TARTRATE 5 MG PO TABS: 5.0000 mg | ORAL_TABLET | Freq: Every evening | ORAL | Status: DC | PRN

## 2018-01-04 MED ORDER — COCONUT OIL OIL: 1.0000 "application " | TOPICAL_OIL | Status: DC | PRN

## 2018-01-04 NOTE — Anesthesia Postprocedure Evaluation (Signed)
Anesthesia Post Note  Patient: Beth Morrow  Procedure(s) Performed: AN AD HOC LABOR EPIDURAL     Patient location during evaluation: Mother Baby Anesthesia Type: Epidural Level of consciousness: awake and alert Pain management: pain level controlled Vital Signs Assessment: post-procedure vital signs reviewed and stable Respiratory status: spontaneous breathing, nonlabored ventilation and respiratory function stable Cardiovascular status: stable Postop Assessment: no headache, no backache, epidural receding and patient able to bend at knees Anesthetic complications: no    Last Vitals:  Vitals:   01/04/18 0338 01/04/18 0727  BP: 99/86 (!) 95/54  Pulse: 66 61  Resp: 18 20  Temp: 36.5 C 37 C  SpO2:      Last Pain:  Vitals:   01/04/18 0727  TempSrc:   PainSc: 0-No pain   Pain Goal: Patients Stated Pain Goal: 2 (01/03/18 1720)               Rica RecordsICKELTON,Luisalberto Beegle

## 2018-01-05 ENCOUNTER — Inpatient Hospital Stay (HOSPITAL_COMMUNITY): Payer: Medicaid Other

## 2018-01-05 MED ORDER — SENNOSIDES-DOCUSATE SODIUM 8.6-50 MG PO TABS: 2.0000 | ORAL_TABLET | ORAL | 0 refills | Status: DC

## 2018-01-05 MED ORDER — IBUPROFEN 600 MG PO TABS: 600.0000 mg | ORAL_TABLET | Freq: Four times a day (QID) | ORAL | 0 refills | Status: DC

## 2018-01-05 NOTE — Discharge Instructions (Signed)
Postpartum Care After Vaginal Delivery °The period of time right after you deliver your newborn is called the postpartum period. °What kind of medical care will I receive? °· You may continue to receive fluids and medicines through an IV tube inserted into one of your veins. °· If an incision was made near your vagina (episiotomy) or if you had some vaginal tearing during delivery, cold compresses may be placed on your episiotomy or your tear. This helps to reduce pain and swelling. °· You may be given a squirt bottle to use when you go to the bathroom. You may use this until you are comfortable wiping as usual. To use the squirt bottle, follow these steps: °? Before you urinate, fill the squirt bottle with warm water. Do not use hot water. °? After you urinate, while you are sitting on the toilet, use the squirt bottle to rinse the area around your urethra and vaginal opening. This rinses away any urine and blood. °? You may do this instead of wiping. As you start healing, you may use the squirt bottle before wiping yourself. Make sure to wipe gently. °? Fill the squirt bottle with clean water every time you use the bathroom. °· You will be given sanitary pads to wear. °How can I expect to feel? °· You may not feel the need to urinate for several hours after delivery. °· You will have some soreness and pain in your abdomen and vagina. °· If you are breastfeeding, you may have uterine contractions every time you breastfeed for up to several weeks postpartum. Uterine contractions help your uterus return to its normal size. °· It is normal to have vaginal bleeding (lochia) after delivery. The amount and appearance of lochia is often similar to a menstrual period in the first week after delivery. It will gradually decrease over the next few weeks to a dry, yellow-brown discharge. For most women, lochia stops completely by 6-8 weeks after delivery. Vaginal bleeding can vary from woman to woman. °· Within the first few  days after delivery, you may have breast engorgement. This is when your breasts feel heavy, full, and uncomfortable. Your breasts may also throb and feel hard, tightly stretched, warm, and tender. After this occurs, you may have milk leaking from your breasts. Your health care provider can help you relieve discomfort due to breast engorgement. Breast engorgement should go away within a few days. °· You may feel more sad or worried than normal due to hormonal changes after delivery. These feelings should not last more than a few days. If these feelings do not go away after several days, speak with your health care provider. °How should I care for myself? °· Tell your health care provider if you have pain or discomfort. °· Drink enough water to keep your urine clear or pale yellow. °· Wash your hands thoroughly with soap and water for at least 20 seconds after changing your sanitary pads, after using the toilet, and before holding or feeding your baby. °· If you are not breastfeeding, avoid touching your breasts a lot. Doing this can make your breasts produce more milk. °· If you become weak or lightheaded, or you feel like you might faint, ask for help before: °? Getting out of bed. °? Showering. °· Change your sanitary pads frequently. Watch for any changes in your flow, such as a sudden increase in volume, a change in color, the passing of large blood clots. If you pass a blood clot from your vagina, save it   to show to your health care provider. Do not flush blood clots down the toilet without having your health care provider look at them. °· Make sure that all your vaccinations are up to date. This can help protect you and your baby from getting certain diseases. You may need to have immunizations done before you leave the hospital. °· If desired, talk with your health care provider about methods of family planning or birth control (contraception). °How can I start bonding with my baby? °Spending as much time as  possible with your baby is very important. During this time, you and your baby can get to know each other and develop a bond. Having your baby stay with you in your room (rooming in) can give you time to get to know your baby. Rooming in can also help you become comfortable caring for your baby. Breastfeeding can also help you bond with your baby. °How can I plan for returning home with my baby? °· Make sure that you have a car seat installed in your vehicle. °? Your car seat should be checked by a certified car seat installer to make sure that it is installed safely. °? Make sure that your baby fits into the car seat safely. °· Ask your health care provider any questions you have about caring for yourself or your baby. Make sure that you are able to contact your health care provider with any questions after leaving the hospital. °This information is not intended to replace advice given to you by your health care provider. Make sure you discuss any questions you have with your health care provider. °Document Released: 03/20/2007 Document Revised: 10/26/2015 Document Reviewed: 04/27/2015 °Elsevier Interactive Patient Education © 2018 [employer] ° °

## 2018-01-05 NOTE — Progress Notes (Addendum)
POSTPARTUM PROGRESS NOTE  Post Partum Day 1  Subjective:  Beth Morrow is a 27 y.o. [id number] 1236w0d s/p svd ppd1 w. A suspected pancreatic mass.  No acute events overnight.  Pt denies problems with ambulating, voiding or po intake.  She denies nausea or vomiting.  Pain is well controlled. Lochia Moderate. Pt denies visualizing clots, and endorses that while lochia is heavier than a typical menstruation cycles she is only changing her sanitary napkin every few hours.   Objective: Patient Vitals for the past 24 hrs:  BP Temp Temp src Pulse Resp  01/04/18 2123 112/65 98.1 F (36.7 C) Oral 70 18  01/04/18 1324 112/65 98.3 F (36.8 C) Oral (!) 55 16    Physical Exam:  General: alert, cooperative and no distress Lochia:normal flow Chest: no respiratory distress, CTAB Heart:regular rate no M/R/G auscultated Abdomen: soft, diffusely tender,  Uterine Fundus  appropriately tender, but unable to palpate fundus in isolation DVT Evaluation: No calf swelling or tenderness Extremities: no edema  Recent Labs    01/03/18 1615  HGB 12.5  HCT 36.1    Assessment/Plan:  ASSESSMENT: Beth Morrow is a 27 y.o. [id number] 4736w0d s/p svd ppd1 recovering well.  Pt has been npo >6hrs, f/u to receive  Abodminal U/S F/U Pap for ASCUS, HPV not detected (15 Jun 2017) Contraception: Depo @ ppf.u in 4-6 weeks  Plan for discharge 4-6W pp f/u    LOS: 2 days   Ronnie DossChika I Kaylan Friedmann MS3 01/05/2018, 9:11 AM

## 2018-01-05 NOTE — Discharge Summary (Signed)
OB Discharge Summary     Patient Name: Beth Morrow DOB: [birth year] MRN: [id number]  Date of admission: 01/03/2018 Delivering MD: Cam Hai D   Date of discharge: 01/05/2018  Admitting diagnosis: 41WKS CTX Intrauterine pregnancy: [redacted]w[redacted]d     Secondary diagnosis:  Active Problems:   Normal spontaneous vaginal delivery  Additional problems:  Pancreatic Mass     Discharge diagnosis: Term Pregnancy Delivered                                                                                                Post partum procedures:none  Augmentation: AROM, Pitocin and Foley Balloon  Complications: None  Hospital course:  Onset of Labor With Vaginal Delivery     27 y.o. yo [id number] at 110w0d was admitted in Latent Labor on 01/03/2018. Patient had an uncomplicated labor course as follows:  Membrane Rupture Time/Date: 10:18 PM ,01/03/2018   Intrapartum Procedures: Episiotomy: None [1]                                         Lacerations:  None [1]  Patient had a delivery of a Viable infant. 01/04/2018  Information for the patient's newborn:  Jamilet, Ambroise [[id number]]  Delivery Method: Vag-Spont    Pateint had an uncomplicated postpartum course.  She is ambulating, tolerating a regular diet, passing flatus, and urinating well. Patient is discharged home in stable condition on 01/05/18.   Physical exam  Vitals:   01/04/18 0727 01/04/18 1324 01/04/18 2123 01/05/18 1425  BP: (!) 95/54 112/65 112/65 106/71  Pulse: 61 (!) 55 70 72  Resp: 20 16 18 18   Temp: 98.6 F (37 C) 98.3 F (36.8 C) 98.1 F (36.7 C) 98.2 F (36.8 C)  TempSrc:  Oral Oral Oral  SpO2:    98%  Weight:      Height:       General: alert, cooperative and no distress Lochia: appropriate Uterine Fundus: firm Incision: N/A DVT Evaluation: No evidence of DVT seen on physical exam. Labs: Lab Results  Component Value Date   WBC 8.1 01/03/2018   HGB 12.5 01/03/2018   HCT 36.1 01/03/2018   MCV 94.8  01/03/2018   PLT 235 01/03/2018   CMP Latest Ref Rng & Units 09/25/2017  Glucose 65 - 99 mg/dL 130(Q)  BUN 6 - 20 mg/dL <6(V)  Creatinine 7.84 - 1.00 mg/dL 6.96  Sodium 295 - 284 mmol/L 135  Potassium 3.5 - 5.1 mmol/L 3.3(L)  Chloride 101 - 111 mmol/L 107  CO2 22 - 32 mmol/L 20(L)  Calcium 8.9 - 10.3 mg/dL 1.3(K)    Discharge instruction: per After Visit Summary and "Baby and Me Booklet".  After visit meds:  Allergies as of 01/05/2018   No Known Allergies     Medication List    STOP taking these medications   COMFORT FIT MATERNITY SUPP LG Misc   sodium-potassium bicarbonate Tbef dissolvable tablet Commonly known as:  ALKA-SELTZER GOLD     TAKE these medications  ibuprofen 600 MG tablet Commonly known as:  ADVIL,MOTRIN Take 1 tablet (600 mg total) by mouth every 6 (six) hours.   prenatal multivitamin Tabs tablet Take 1 tablet by mouth daily at 12 noon.   senna-docusate 8.6-50 MG tablet Commonly known as:  Senokot-S Take 2 tablets by mouth daily. Start taking on:  01/06/2018       Diet: routine diet  Activity: Advance as tolerated. Pelvic rest for 6 weeks.   Outpatient follow up:4 weeks Follow up Appt: Future Appointments  Date Time Provider Department Center  02/01/2018  1:00 PM Adam PhenixArnold, James G, MD [facility] None   Follow up Visit:No follow-ups on file.  Postpartum contraception: Depo Provera  Newborn Data: Live born female  Birth Weight: 7 lb 8.5 oz (3416 g) APGAR: 9, 9  Newborn Delivery   Birth date/time:  01/04/2018 00:39:00 Delivery type:  Vaginal, Spontaneous     Baby Feeding: Bottle Disposition:home with mother   01/05/2018 De Hollingsheadatherine L Asante Blanda, DO

## 2018-01-12 ENCOUNTER — Ambulatory Visit (HOSPITAL_COMMUNITY): Payer: Medicaid Other

## 2018-02-01 ENCOUNTER — Encounter: Payer: Self-pay | Admitting: Obstetrics & Gynecology

## 2018-02-01 ENCOUNTER — Telehealth: Payer: Self-pay | Admitting: Gastroenterology

## 2018-02-01 ENCOUNTER — Ambulatory Visit (INDEPENDENT_AMBULATORY_CARE_PROVIDER_SITE_OTHER): Payer: Medicaid Other | Admitting: Obstetrics & Gynecology

## 2018-02-01 VITALS — BP 116/75 | HR 65 | Ht 63.0 in | Wt 144.0 lb

## 2018-02-01 DIAGNOSIS — K8689 Other specified diseases of pancreas: Secondary | ICD-10-CM

## 2018-02-01 DIAGNOSIS — Z1389 Encounter for screening for other disorder: Secondary | ICD-10-CM

## 2018-02-01 DIAGNOSIS — Z3042 Encounter for surveillance of injectable contraceptive: Secondary | ICD-10-CM | POA: Diagnosis not present

## 2018-02-01 DIAGNOSIS — O09299 Supervision of pregnancy with other poor reproductive or obstetric history, unspecified trimester: Secondary | ICD-10-CM

## 2018-02-01 DIAGNOSIS — K869 Disease of pancreas, unspecified: Secondary | ICD-10-CM

## 2018-02-01 MED ORDER — MEDROXYPROGESTERONE ACETATE 150 MG/ML IM SUSP
150.0000 mg | Freq: Once | INTRAMUSCULAR | Status: AC
  Administered 2018-02-01: 150 mg via INTRAMUSCULAR

## 2018-02-01 NOTE — Progress Notes (Signed)
Post Partum Exam  Beth Morrow is a 27 y.o. 732P2002 female who presents for a postpartum visit. She is 4 weeks postpartum following a spontaneous vaginal delivery. I have fully reviewed the prenatal and intrapartum course. The delivery was at 40.6 gestational weeks.  Anesthesia: epidural. Postpartum course has been doing well. Baby's course has been doing well. Baby is feeding by [employer]. Bleeding no bleeding. Bowel function is normal. Bladder function is normal. Patient is not sexually active. Contraception method is abstinence. Interested in Depo Provera. Postpartum depression screening:neg, score 0.  The following portions of the patient's history were reviewed and updated as appropriate: allergies, current medications, past family history, past medical history, past social history, past surgical history and problem list. Last pap smear done 06/2017 and was Abnormal- ASCUS  Review of Systems Pertinent items are noted in HPI.    Objective:  unknown if currently breastfeeding.  General:  alert, cooperative and no distress           Abdomen: soft, non-tender; bowel sounds normal; no masses,  no organomegaly   Vulva:  not evaluated  Vagina: not evaluated   Assessment:    normal postpartum exam. Pap smear not done at today's visit.   Plan:   1. Contraception: Depo-Provera injections 2. Injection today 3. Follow up in: 3 months or as needed.   Adam PhenixArnold, James G, MD 02/01/2018

## 2018-02-01 NOTE — Telephone Encounter (Signed)
Routed to Dr. Armbruster. 

## 2018-02-01 NOTE — Telephone Encounter (Signed)
I have looked through this patient's chart - I see a CT scan from April this year showing a fluid collection anterior to the pancreas. Follow up US x 2 did not show this. In looking through her chart she [facility], thus this looks like a chronic issue. Raynelle FanningJulie do you have any other information about other imaging showing anything otherwise? I don't see evidence of pancreatic mass on imaging unless she has been imaged elsewhere which I cannot see.  She may likely warrant MRCP or EUS to further evaluate. She can be booked with any provider, first available, although Dr. Christella HartiganJacobs / Monsouraty would be best for this patient for this issue in case she requires EUS. Can book with an APP if nothing open within a few weeks. Can you let me know? Thanks

## 2018-02-01 NOTE — Patient Instructions (Signed)

## 2018-02-01 NOTE — Progress Notes (Signed)
Pt was given Depo at today's visit. Pt tolerated well. Depo policy reviewed and pt states understanding. Pt advised to RTO 11/14-11/28 for next depo.   Administrations This Visit    medroxyPROGESTERone (DEPO-PROVERA) injection 150 mg    Admin Date 02/01/2018 Action Given Dose 150 mg Route Intramuscular Administered By Lanney GinsFoster, Majesta Leichter D, CMA

## 2018-02-02 ENCOUNTER — Encounter: Payer: Self-pay | Admitting: Physician Assistant

## 2018-02-02 NOTE — Telephone Encounter (Signed)
This looks like a chronic issue, I don't see mention of pancreatic mass in yesterdays office note only from previous imagining. Please schedule with Dr. Evalee JeffersonJacobs/Mansouraty within next few weeks or if nothing available then APP. Erie NoeVanessa please let me know when patient is scheduled. Thank you.

## 2018-02-02 NOTE — Telephone Encounter (Signed)
Please see below.

## 2018-02-02 NOTE — Telephone Encounter (Signed)
Patient has been scheduled to see Hyacinth MeekerJennifer Lemmon, PA-C on 02/19/18 at 9:15am.

## 2018-02-02 NOTE — Telephone Encounter (Signed)
Great, thanks much 

## 2018-02-19 ENCOUNTER — Ambulatory Visit: Payer: Medicaid Other | Admitting: Physician Assistant

## 2018-02-19 ENCOUNTER — Telehealth: Payer: Self-pay

## 2018-02-19 NOTE — Telephone Encounter (Signed)
TC from pt needing RTW note Pt had normal post partum Noted printed and faxed to pt job.

## 2018-02-23 ENCOUNTER — Encounter: Payer: Self-pay | Admitting: Obstetrics & Gynecology

## 2018-02-27 ENCOUNTER — Encounter (HOSPITAL_COMMUNITY): Payer: Self-pay | Admitting: *Deleted

## 2018-02-27 ENCOUNTER — Inpatient Hospital Stay (HOSPITAL_COMMUNITY): Payer: Medicaid Other

## 2018-02-27 ENCOUNTER — Inpatient Hospital Stay (HOSPITAL_COMMUNITY)
Admission: AD | Admit: 2018-02-27 | Discharge: 2018-02-27 | Disposition: A | Discharge status: Home / Self Care | Payer: Medicaid Other | Source: Ambulatory Visit | Attending: Obstetrics and Gynecology | Admitting: Obstetrics and Gynecology

## 2018-02-27 ENCOUNTER — Other Ambulatory Visit: Payer: Self-pay

## 2018-02-27 DIAGNOSIS — N3001 Acute cystitis with hematuria: Secondary | ICD-10-CM | POA: Insufficient documentation

## 2018-02-27 DIAGNOSIS — R102 Pelvic and perineal pain: Secondary | ICD-10-CM | POA: Insufficient documentation

## 2018-02-27 DIAGNOSIS — Z87891 Personal history of nicotine dependence: Secondary | ICD-10-CM | POA: Diagnosis not present

## 2018-02-27 LAB — URINALYSIS, ROUTINE W REFLEX MICROSCOPIC
BACTERIA UA: NONE SEEN
Bilirubin Urine: NEGATIVE
Glucose, UA: NEGATIVE mg/dL
Hgb urine dipstick: NEGATIVE
Ketones, ur: 5 mg/dL — AB
Leukocytes, UA: NEGATIVE
Nitrite: NEGATIVE
PROTEIN: 30 mg/dL — AB
Specific Gravity, Urine: 1.028 (ref 1.005–1.030)
pH: 5 (ref 5.0–8.0)

## 2018-02-27 LAB — CBC
HCT: 35.4 % — ABNORMAL LOW (ref 36.0–46.0)
HEMOGLOBIN: 12.2 g/dL (ref 12.0–15.0)
MCH: 31.9 pg (ref 26.0–34.0)
MCHC: 34.5 g/dL (ref 30.0–36.0)
MCV: 92.4 fL (ref 78.0–100.0)
Platelets: 305 10*3/uL (ref 150–400)
RBC: 3.83 MIL/uL — ABNORMAL LOW (ref 3.87–5.11)
RDW: 12.9 % (ref 11.5–15.5)
WBC: 11.1 10*3/uL — AB (ref 4.0–10.5)

## 2018-02-27 LAB — WET PREP, GENITAL
SPERM: NONE SEEN
TRICH WET PREP: NONE SEEN
YEAST WET PREP: NONE SEEN

## 2018-02-27 LAB — POCT PREGNANCY, URINE: PREG TEST UR: NEGATIVE

## 2018-02-27 MED ORDER — IBUPROFEN 600 MG PO TABS: 600.0000 mg | ORAL_TABLET | Freq: Four times a day (QID) | ORAL | 0 refills | Status: DC

## 2018-02-27 MED ORDER — KETOROLAC TROMETHAMINE 30 MG/ML IJ SOLN
30.0000 mg | Freq: Once | INTRAMUSCULAR | Status: AC
  Administered 2018-02-27: 30 mg via INTRAMUSCULAR
  Filled 2018-02-27: qty 1

## 2018-02-27 MED ORDER — SULFAMETHOXAZOLE-TRIMETHOPRIM 800-160 MG PO TABS: 1.0000 | ORAL_TABLET | Freq: Two times a day (BID) | ORAL | 0 refills | Status: DC

## 2018-02-27 MED ORDER — NIFEDIPINE 10 MG PO CAPS: 10.0000 mg | ORAL_CAPSULE | ORAL | Status: DC

## 2018-02-27 NOTE — MAU Note (Signed)
Pain started on Sunday, was drawing a bath for her son. Sharp pains in lower abd, rated a 10.  Thought it was 'bathroom' related, got that squared away, but the pain has continued.  Period went off, but has had some spotting at times since. Denies urinary problems.

## 2018-02-27 NOTE — MAU Provider Note (Signed)
History     CSN: [id number]  Arrival date and time: 02/27/18 0941   First Provider Initiated Contact with Patient 02/27/18 1032      Chief Complaint  Patient presents with  . Vaginal Bleeding  . Abdominal Pain   27 y.o. female 7 weeks post SVD here with LAP and pelvic pain. Pain started 2 days ago. Describes as soreness in her central lower abdomen and bilateral. Pain is worse with movement and ambulation. Rates 7/10. Has not tried anything for it. She originally thought it was constipation but had a BM and it didn't improve. Denies N/V or fevers. Endorses poor appetite. No vaginal discharge, itching, or odor. She is sexually active, no new partner. Denies painful IC. Had her first menses last week. Using Depo for contraception. Has some lite spotting. No urinary sx.  Past Medical History:  Diagnosis Date  . Dental abscess   . Medical history non-contributory   . Pancreatic mass     Past Surgical History:  Procedure Laterality Date  . NO PAST SURGERIES      Family History  Adopted: Yes    Social History   Tobacco Use  . Smoking status: Former Smoker    Types: Cigarettes  . Smokeless tobacco: Never Used  . Tobacco comment: Quit August 2018  Substance Use Topics  . Alcohol use: No  . Drug use: No    Allergies: No Known Allergies  Medications Prior to Admission  Medication Sig Dispense Refill Last Dose  . Prenatal Vit-Fe Fumarate-FA (PRENATAL MULTIVITAMIN) TABS tablet Take 1 tablet by mouth daily at 12 noon.   Taking  . senna-docusate (SENOKOT-S) 8.6-50 MG tablet Take 2 tablets by mouth daily. (Patient not taking: Reported on 02/01/2018) 20 tablet 0 Not Taking  . [DISCONTINUED] ibuprofen (ADVIL,MOTRIN) 600 MG tablet Take 1 tablet (600 mg total) by mouth every 6 (six) hours. (Patient not taking: Reported on 02/01/2018) 30 tablet 0 Not Taking    Review of Systems  Constitutional: Positive for appetite change. Negative for chills and fever.  Gastrointestinal: Positive  for abdominal pain. Negative for constipation, diarrhea, nausea and vomiting.  Genitourinary: Positive for vaginal bleeding. Negative for dyspareunia, dysuria, frequency, hematuria, urgency and vaginal discharge.   Physical Exam   Blood pressure 110/75, pulse 71, temperature 98.4 F (36.9 C), temperature source Oral, resp. rate 16, weight 62.9 kg, last menstrual period 02/18/2018, SpO2 100 %, unknown if currently breastfeeding.  Physical Exam  Nursing note and vitals reviewed. Constitutional: She is oriented to person, place, and time. She appears well-developed and well-nourished. No distress (appears comfortable).  HENT:  Head: Normocephalic and atraumatic.  Neck: Normal range of motion.  Cardiovascular: Normal rate.  Respiratory: Effort normal. No respiratory distress.  GI: Soft. Bowel sounds are normal. She exhibits no distension and no mass. There is tenderness in the right lower quadrant and left lower quadrant. There is no rebound and no guarding.  Genitourinary:  Genitourinary Comments: External: no lesions or erythema Vagina: rugated, pink, moist, thick clumpy brown discharge Uterus: non enlarged, anteverted, ++ tender, no CMT Adnexae: no masses, + tenderness left, ++ tenderness right Cervix closed, nml  Musculoskeletal: Normal range of motion.  Neurological: She is alert and oriented to person, place, and time.  Skin: Skin is warm and dry.  Psychiatric: She has a normal mood and affect.   Results for orders placed or performed during the hospital encounter of 02/27/18 (from the past 24 hour(s))  Pregnancy, urine POC     Status: None  Collection Time: 02/27/18 10:13 AM  Result Value Ref Range   Preg Test, Ur NEGATIVE NEGATIVE  CBC     Status: Abnormal   Collection Time: 02/27/18 10:44 AM  Result Value Ref Range   WBC 11.1 (H) 4.0 - 10.5 K/uL   RBC 3.83 (L) 3.87 - 5.11 MIL/uL   Hemoglobin 12.2 12.0 - 15.0 g/dL   HCT 16.1 (L) 09.6 - 04.5 %   MCV 92.4 78.0 - 100.0 fL    MCH 31.9 26.0 - 34.0 pg   MCHC 34.5 30.0 - 36.0 g/dL   RDW 40.9 81.1 - 91.4 %   Platelets 305 150 - 400 K/uL  Wet prep, genital     Status: Abnormal   Collection Time: 02/27/18 11:07 AM  Result Value Ref Range   Yeast Wet Prep HPF POC NONE SEEN NONE SEEN   Trich, Wet Prep NONE SEEN NONE SEEN   Clue Cells Wet Prep HPF POC PRESENT (A) NONE SEEN   WBC, Wet Prep HPF POC FEW (A) NONE SEEN   Sperm NONE SEEN   Urinalysis, Routine w reflex microscopic     Status: Abnormal   Collection Time: 02/27/18 12:36 PM  Result Value Ref Range   Color, Urine AMBER (A) YELLOW   APPearance CLOUDY (A) CLEAR   Specific Gravity, Urine 1.028 1.005 - 1.030   pH 5.0 5.0 - 8.0   Glucose, UA NEGATIVE NEGATIVE mg/dL   Hgb urine dipstick NEGATIVE NEGATIVE   Bilirubin Urine NEGATIVE NEGATIVE   Ketones, ur 5 (A) NEGATIVE mg/dL   Protein, ur 30 (A) NEGATIVE mg/dL   Nitrite NEGATIVE NEGATIVE   Leukocytes, UA NEGATIVE NEGATIVE   RBC / HPF 0-5 0 - 5 RBC/hpf   WBC, UA 21-50 0 - 5 WBC/hpf   Bacteria, UA NONE SEEN NONE SEEN   Squamous Epithelial / LPF 0-5 0 - 5   Mucus PRESENT    MAU Course  Procedures Toradol  MDM Labs and pelvic US ordered and reviewed. No evidence of acute abdominal or pelvic process. UA with ++WBCs, will treat for presumptive UTI. UC sent. GC pending. Stable for discharge home.   Assessment and Plan   1. Acute cystitis with hematuria   2. Acute pelvic pain    Discharge home Follow up as needed Rx Bactrim Rx Ibuprofen Hydrate Return precautions  Allergies as of 02/27/2018   No Known Allergies     Medication List    STOP taking these medications   senna-docusate 8.6-50 MG tablet Commonly known as:  Senokot-S     TAKE these medications   ibuprofen 600 MG tablet Commonly known as:  ADVIL,MOTRIN Take 1 tablet (600 mg total) by mouth every 6 (six) hours.   prenatal multivitamin Tabs tablet Take 1 tablet by mouth daily at 12 noon.   sulfamethoxazole-trimethoprim 800-160 MG  tablet Commonly known as:  BACTRIM DS,SEPTRA DS Take 1 tablet by mouth 2 (two) times daily.      Donette Larry, CNM 02/27/2018, 1:16 PM

## 2018-02-27 NOTE — MAU Note (Signed)
Unable to urinate at this time.  

## 2018-02-27 NOTE — Discharge Instructions (Signed)

## 2018-02-28 LAB — URINE CULTURE: Culture: <10000 — AB

## 2018-02-28 LAB — GC/CHLAMYDIA PROBE AMP (~~LOC~~) NOT AT ARMC
CHLAMYDIA, DNA PROBE: NEGATIVE
NEISSERIA GONORRHEA: NEGATIVE

## 2018-03-07 ENCOUNTER — Encounter: Payer: Self-pay | Admitting: Physician Assistant

## 2018-03-07 ENCOUNTER — Ambulatory Visit (INDEPENDENT_AMBULATORY_CARE_PROVIDER_SITE_OTHER): Payer: Medicaid Other | Admitting: Physician Assistant

## 2018-03-07 VITALS — BP 104/64 | HR 65 | Ht 63.0 in | Wt 139.2 lb

## 2018-03-07 DIAGNOSIS — R9389 Abnormal findings on diagnostic imaging of other specified body structures: Secondary | ICD-10-CM

## 2018-03-07 NOTE — Progress Notes (Signed)
I agree with the above note, plan 

## 2018-03-07 NOTE — Patient Instructions (Signed)
You have been scheduled for an MRI at [facility] on 03-09-18. Your appointment time is 8 am. Please arrive 30 minutes prior to your appointment time for registration purposes. Please make certain not to have anything to eat or drink 6 hours prior to your test. In addition, if you have any metal in your body, have a pacemaker or defibrillator, please be sure to let your ordering physician know. This test typically takes 45 minutes to 1 hour to complete. Should you need to reschedule, please call [phone] to do so.

## 2018-03-07 NOTE — Progress Notes (Signed)
Chief Complaint: Abnormal CT of the pancreas  HPI:    Beth Morrow is a 27 year old African-American female, remotely know to Dr. Christella Hartigan, with a past medical history as listed below, who was referred to me by Adam Phenix, MD for a complaint of abnormal CT of the pancreas.      09/25/2017 CT angio chest PE with or without contrast due to acute onset of shortness of breath and recent long car trip with upper chest pain showed visualized portions of the liver and spleen are unremarkable.  There was an unusual 5-6 cm fluid density structure along the anterior aspect of the pancreatic head which may reflect part of the pancreas or may be related to the patient's gravid uterus.  MRCP was recommended for follow-up.    01/05/2018 abdominal ultrasound showed a normal-appearing pancreas.    02/27/2018 seen in the ER for pelvic pain.  Diagnosed with acute cystitis with hematuria and given Bactrim as well as Ibuprofen.    Today, patient tells me that she was told to follow with [place] after an abnormal CT done while she was pregnant.  Apparently, just had some lower abdominal pain and was again told to follow with [place] in regards to this.  Describes that she had abdominal pain which is better now, this occurred for 4 straight days last week, but got better after the antibiotics given.  Currently denies any GI symptoms.    Denies fever, chills, weight loss, nausea, vomiting, heartburn, reflux, change in bowel habits or symptoms that awaken her from sleep.  Past Medical History:  Diagnosis Date  . Dental abscess   . Medical history non-contributory   . Pancreatic mass     Past Surgical History:  Procedure Laterality Date  . NO PAST SURGERIES      No current outpatient medications on file.   No current facility-administered medications for this visit.     Allergies as of 03/07/2018  . (No Known Allergies)    Family History  Adopted: Yes    Social History   Socioeconomic History  . Marital  status: Single    Spouse name: Not on file  . Number of children: Not on file  . Years of education: Not on file  . Highest education level: Not on file  Occupational History  . Occupation: [employer]  Social Needs  . Financial resource strain: Not on file  . Food insecurity:    Worry: Not on file    Inability: Not on file  . Transportation needs:    Medical: Not on file    Non-medical: Not on file  Tobacco Use  . Smoking status: Former Smoker    Types: Cigarettes  . Smokeless tobacco: Never Used  . Tobacco comment: Quit August 2018  Substance and Sexual Activity  . Alcohol use: No  . Drug use: No  . Sexual activity: Yes    Birth control/protection: None, Injection  Lifestyle  . Physical activity:    Days per week: Not on file    Minutes per session: Not on file  . Stress: Not on file  Relationships  . Social connections:    Talks on phone: Not on file    Gets together: Not on file    Attends religious service: Not on file    Active member of club or organization: Not on file    Attends meetings of clubs or organizations: Not on file    Relationship status: Not on file  . Intimate partner violence:  Fear of current or ex partner: Not on file    Emotionally abused: Not on file    Physically abused: Not on file    Forced sexual activity: Not on file  Other Topics Concern  . Not on file  Social History Narrative  . Not on file    Review of Systems:    Constitutional: No weight loss, fever or chills Skin: No rash  Cardiovascular: No chest pain Respiratory: No SOB  Gastrointestinal: See HPI and otherwise negative Genitourinary: No dysuria  Neurological: No headache, dizziness or syncope Musculoskeletal: No new muscle or joint pain Hematologic: No bleeding  Psychiatric: No history of depression or anxiety   Physical Exam:  Vital signs: BP 104/64   Pulse 65   Ht 5\' 3"  (1.6 m)   Wt 139 lb 3.2 oz (63.1 kg)   LMP 02/18/2018   BMI 24.66 kg/m    Constitutional:   AA female appears to be in NAD, Well developed, Well nourished, alert and cooperative Head:  Normocephalic and atraumatic. Eyes:   PEERL, EOMI. No icterus. Conjunctiva pink. Ears:  Normal auditory acuity. Neck:  Supple Throat: Oral cavity and pharynx without inflammation, swelling or lesion.  Respiratory: Respirations even and unlabored. Lungs clear to auscultation bilaterally.   No wheezes, crackles, or rhonchi.  Cardiovascular: Normal S1, S2. No MRG. Regular rate and rhythm. No peripheral edema, cyanosis or pallor.  Gastrointestinal:  Soft, nondistended, nontender. No rebound or guarding. Normal bowel sounds. No appreciable masses or hepatomegaly. Rectal:  Not performed.  Msk:  Symmetrical without gross deformities. Without edema, no deformity or joint abnormality.  Neurologic:  Alert and  oriented x4;  grossly normal neurologically.  Skin:   Dry and intact without significant lesions or rashes. Psychiatric:  Demonstrates good judgement and reason without abnormal affect or behaviors.  MOST RECENT LABS AND IMAGING: CBC    Component Value Date/Time   WBC 11.1 (H) 02/27/2018 1044   RBC 3.83 (L) 02/27/2018 1044   HGB 12.2 02/27/2018 1044   HGB 11.6 10/04/2017 1025   HGB 13.1 05/19/2017   HCT 35.4 (L) 02/27/2018 1044   HCT 35.6 10/04/2017 1025   HCT 38 05/19/2017   PLT 305 02/27/2018 1044   PLT 265 10/04/2017 1025   PLT 296 05/19/2017   MCV 92.4 02/27/2018 1044   MCV 97 10/04/2017 1025   MCH 31.9 02/27/2018 1044   MCHC 34.5 02/27/2018 1044   RDW 12.9 02/27/2018 1044   RDW 14.4 10/04/2017 1025   LYMPHSABS 1.4 10/27/2014 1347   MONOABS 0.9 10/27/2014 1347   EOSABS 0.1 10/27/2014 1347   BASOSABS 0.0 10/27/2014 1347    CMP     Component Value Date/Time   NA 135 09/25/2017 1349   K 3.3 (L) 09/25/2017 1349   CL 107 09/25/2017 1349   CO2 20 (L) 09/25/2017 1349   GLUCOSE 102 (H) 09/25/2017 1349   BUN <5 (L) 09/25/2017 1349   CREATININE 0.60 09/25/2017  1349   CALCIUM 8.5 (L) 09/25/2017 1349   GFRNONAA >60 09/25/2017 1349   GFRAA >60 09/25/2017 1349    Assessment: 1.  Abnormal CT of the pancreas: CT Angio of the chest showed up with an abnormal finding in the pancreas while the patient was pregnant, recently she had some abdominal pain and was again told to follow with [place] in regards to this, no symptoms currently  Plan: 1.  Discussed with the patient I believe her abdominal pain was related to her acute cystitis which is not  better after antibiotics. 2.  Ordered MRI/MRCP with pancreatic protocol for further evaluation of abnormal CT 3.  Patient will follow in clinic per recommendations after time of imaging with Dr. Christella Hartigan or myself  Hyacinth Meeker, PA-C [facility] 03/07/2018, 1:27 PM  Cc: Adam Phenix, MD

## 2018-03-09 ENCOUNTER — Other Ambulatory Visit (HOSPITAL_COMMUNITY): Payer: Self-pay | Admitting: Radiology

## 2018-03-09 ENCOUNTER — Ambulatory Visit (HOSPITAL_COMMUNITY)
Admission: RE | Admit: 2018-03-09 | Discharge: 2018-03-09 | Disposition: A | Discharge status: Home / Self Care | Payer: Medicaid Other | Source: Ambulatory Visit | Attending: Physician Assistant | Admitting: Physician Assistant

## 2018-03-09 ENCOUNTER — Other Ambulatory Visit: Payer: Self-pay | Admitting: Physician Assistant

## 2018-03-09 DIAGNOSIS — R9389 Abnormal findings on diagnostic imaging of other specified body structures: Secondary | ICD-10-CM

## 2018-03-09 MED ORDER — GADOBUTROL 1 MMOL/ML IV SOLN: 7.5000 mL | Freq: Once | INTRAVENOUS | Status: DC | PRN

## 2018-03-15 ENCOUNTER — Emergency Department (HOSPITAL_COMMUNITY)
Admission: EM | Admit: 2018-03-15 | Discharge: 2018-03-16 | Disposition: A | Discharge status: Home / Self Care | Payer: Medicaid Other | Attending: Emergency Medicine | Admitting: Emergency Medicine

## 2018-03-15 ENCOUNTER — Other Ambulatory Visit: Payer: Self-pay

## 2018-03-15 ENCOUNTER — Encounter (HOSPITAL_COMMUNITY): Payer: Self-pay | Admitting: Emergency Medicine

## 2018-03-15 DIAGNOSIS — S39012A Strain of muscle, fascia and tendon of lower back, initial encounter: Secondary | ICD-10-CM | POA: Diagnosis not present

## 2018-03-15 DIAGNOSIS — S3992XA Unspecified injury of lower back, initial encounter: Secondary | ICD-10-CM | POA: Diagnosis present

## 2018-03-15 DIAGNOSIS — Z87891 Personal history of nicotine dependence: Secondary | ICD-10-CM | POA: Diagnosis not present

## 2018-03-15 DIAGNOSIS — Y92410 Unspecified street and highway as the place of occurrence of the external cause: Secondary | ICD-10-CM | POA: Insufficient documentation

## 2018-03-15 DIAGNOSIS — Y9389 Activity, other specified: Secondary | ICD-10-CM | POA: Diagnosis not present

## 2018-03-15 DIAGNOSIS — Y998 Other external cause status: Secondary | ICD-10-CM | POA: Diagnosis not present
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
## 2018-03-16 ENCOUNTER — Emergency Department (HOSPITAL_COMMUNITY): Payer: Medicaid Other

## 2018-03-16 ENCOUNTER — Other Ambulatory Visit: Payer: Self-pay

## 2018-03-16 LAB — PREGNANCY, URINE: PREG TEST UR: NEGATIVE

## 2018-03-16 MED ORDER — NAPROXEN 375 MG PO TABS: ORAL_TABLET | ORAL | 0 refills | Status: DC

## 2018-03-16 MED ORDER — NAPROXEN 500 MG PO TABS
500.0000 mg | ORAL_TABLET | Freq: Once | ORAL | Status: AC
  Administered 2018-03-16: 500 mg via ORAL
  Filled 2018-03-16: qty 1

## 2018-03-16 NOTE — ED Notes (Signed)
Attempted to discharge patient. Pt left without discharge instructions and is unable to sign for discharge paperwork. Prescription was sent to pts pharmacy per Dr. Read Drivers upon discharge.
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
## 2018-04-24 ENCOUNTER — Ambulatory Visit: Payer: Medicaid Other

## 2018-12-05 IMAGING — CT CT ANGIO CHEST
1 of 8 series · 18 of 36 positions shown · IV contrast (iopamidol)
Comparison: None.

CLINICAL DATA: Acute onset of shortness of breath. Recent long car
trip. Upper chest pain.

EXAM:
CT ANGIOGRAPHY CHEST WITH CONTRAST
TECHNIQUE: Multidetector CT imaging of the chest was performed using the
standard protocol during bolus administration of intravenous
contrast. Multiplanar CT image reconstructions and MIPs were
obtained to evaluate the vascular anatomy.
CONTRAST:  100mL K4IBKQ-S8T IOPAMIDOL (K4IBKQ-S8T) INJECTION 76%

[Series 6: (person_name) thins · axial · 0.62mm/px · z∈[+703,+948]mm · 18 of 390 slices shown]
[im 20/390  lung]
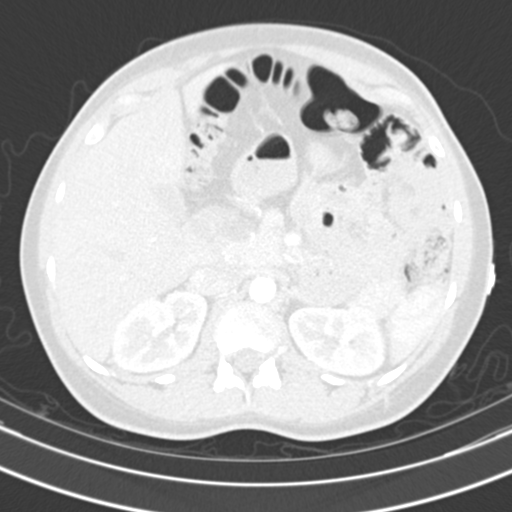
[im 39/390  mediastinal]
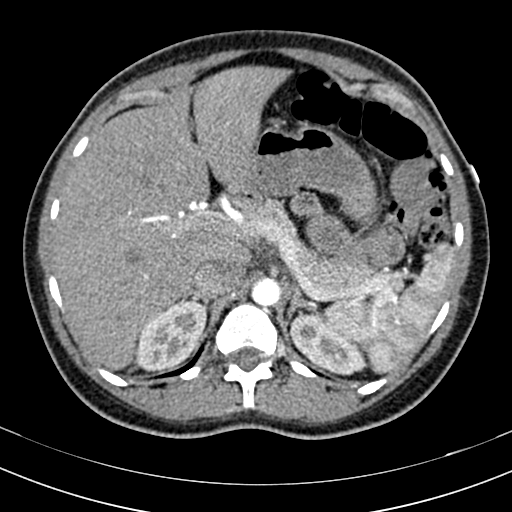
[im 59/390  lung]
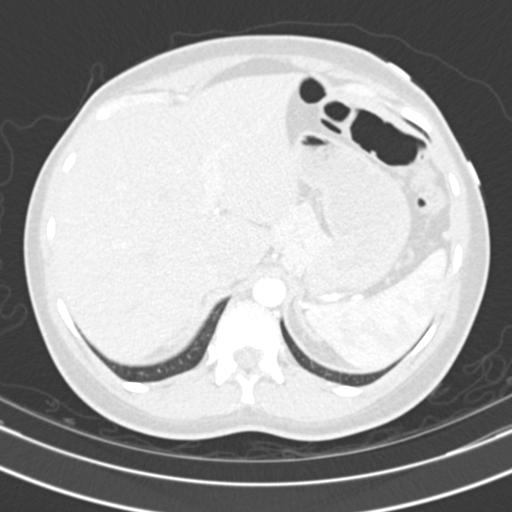
[im 78/390  mediastinal]
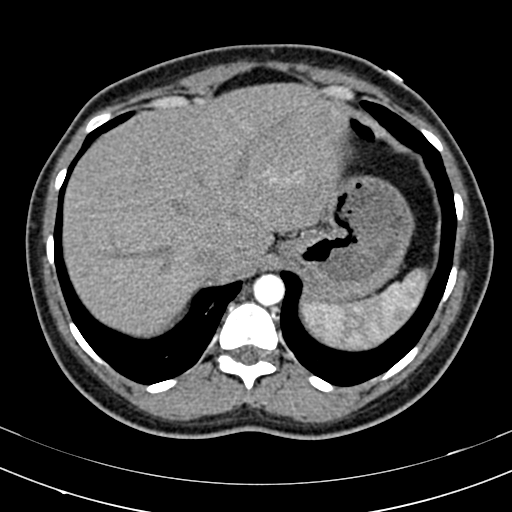
[im 98/390  lung]
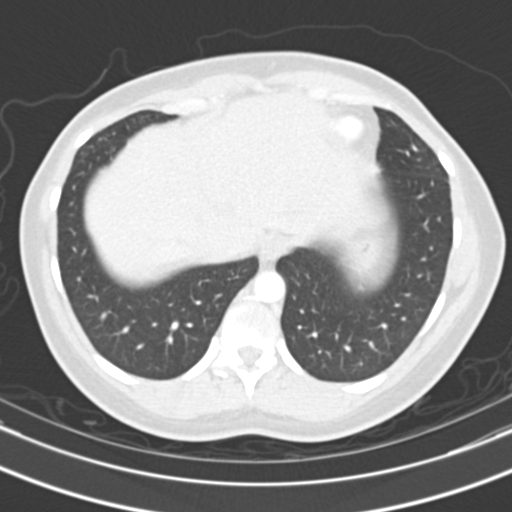
[im 117/390  mediastinal]
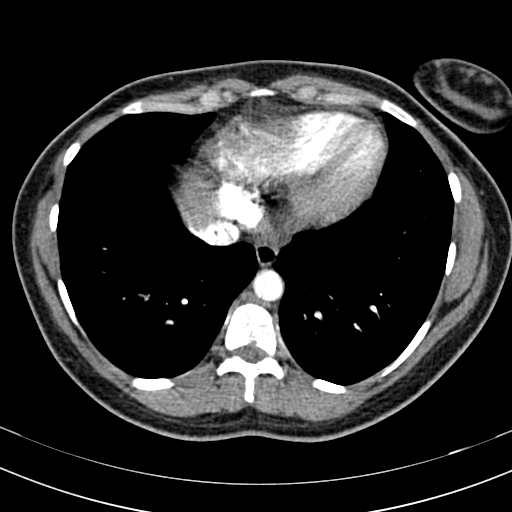
[im 137/390  lung]
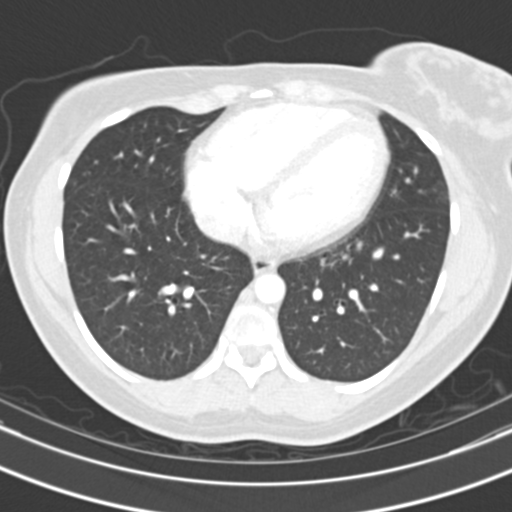
[im 156/390  mediastinal]
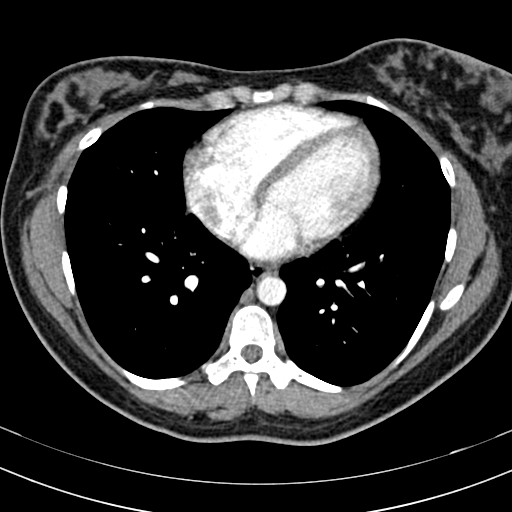
[im 176/390  lung]
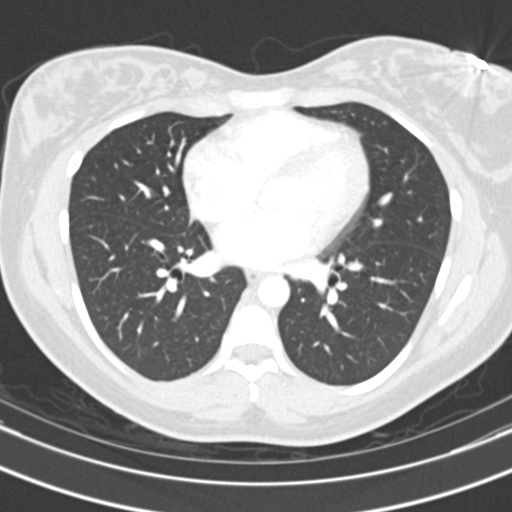
[im 214/390  mediastinal]
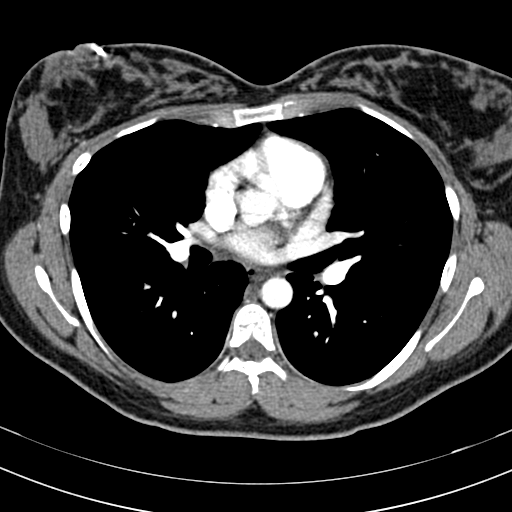
[im 234/390  lung]
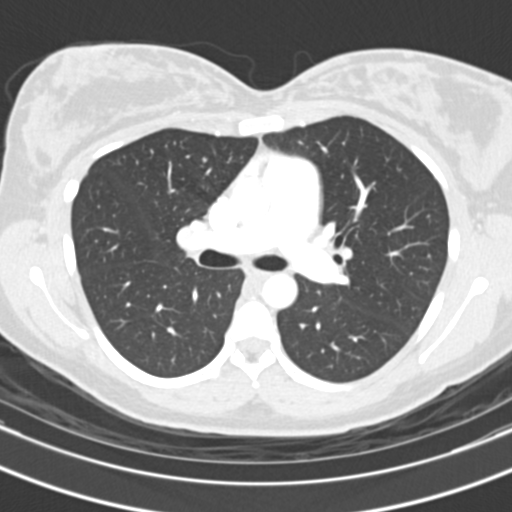
[im 253/390  mediastinal]
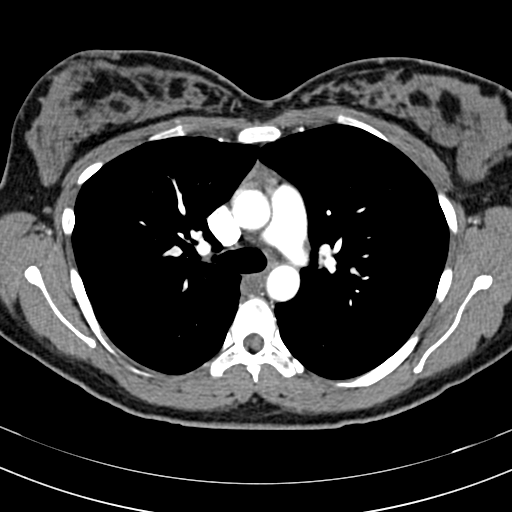
[im 273/390  lung]
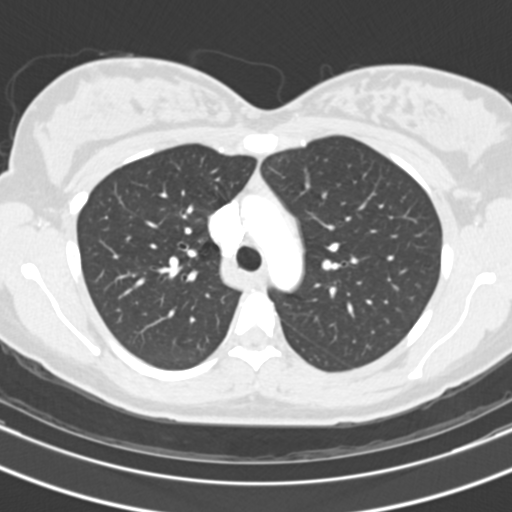
[im 292/390  mediastinal]
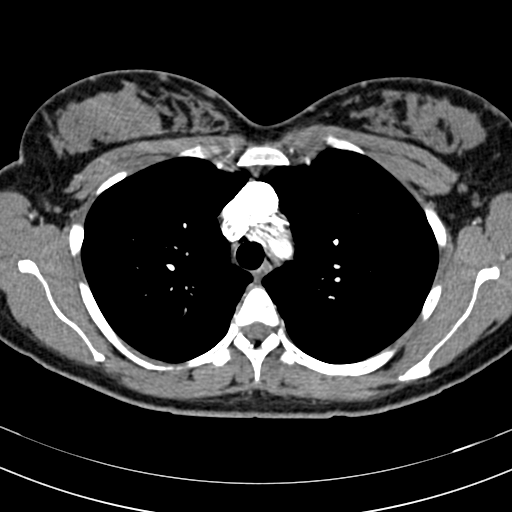
[im 312/390  lung]
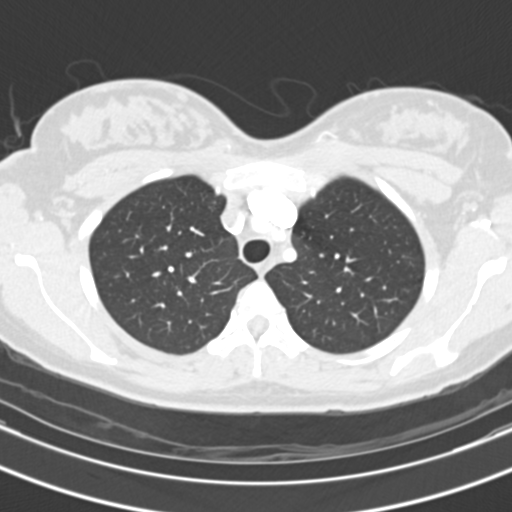
[im 331/390  mediastinal]
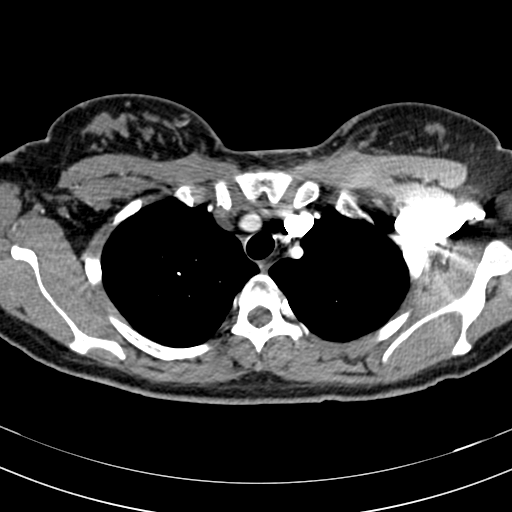
[im 351/390  lung]
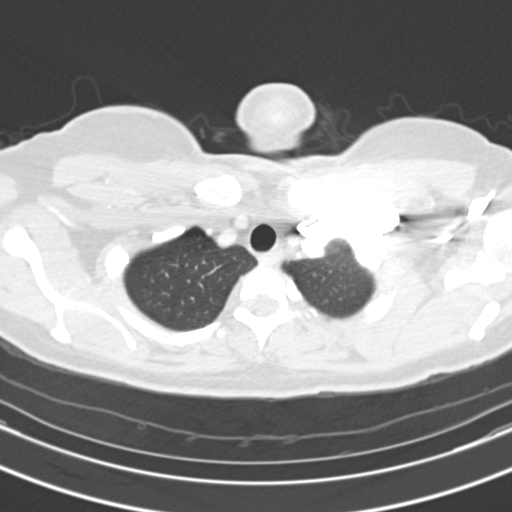
[im 370/390  mediastinal]
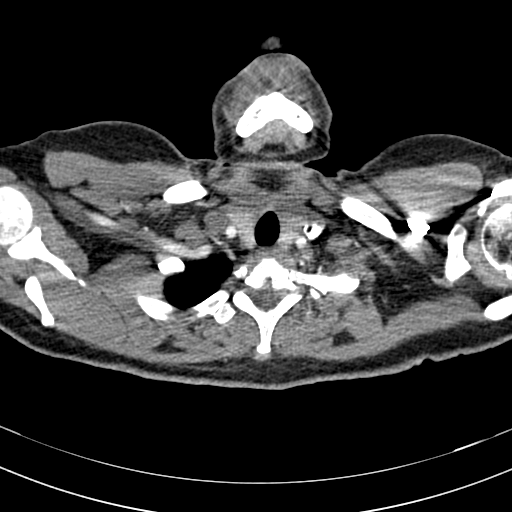

[18 of 36 positions shown; findings below may reference images not displayed]

FINDINGS: Cardiovascular: There is no evidence of pulmonary embolus. The heart
is normal in size. The thoracic aorta is unremarkable. The great
vessels are within normal limits.

Mediastinum/Nodes: The mediastinum is unremarkable in appearance. No
mediastinal lymphadenopathy is seen. No pericardial effusion is
identified. The visualized portions of the thyroid gland are
unremarkable. No axillary lymphadenopathy is appreciated.

Lungs/Pleura: The lungs are clear bilaterally. No focal
consolidation, pleural effusion or pneumothorax is seen. No masses
are identified.

Upper Abdomen: The visualized portions of the liver and spleen are
unremarkable. There is an unusual 5-6 cm fluid density structure
along the anterior aspect of the pancreatic head. This may reflect
part of the pancreas, or may be related to the patient's gravid
uterus.

Musculoskeletal: No acute osseous abnormalities are identified. The
visualized musculature is unremarkable in appearance.

Review of the MIP images confirms the above findings.
IMPRESSION: 1. No evidence of pulmonary embolus.
2. Lungs clear bilaterally.
3. Unusual 5-6 cm fluid density structure along the anterior aspect
of the pancreatic head. This may reflect part of pancreas, or may be
related to the patient's gravid uterus. MRCP would be helpful for
further evaluation, as deemed clinically appropriate.

These results were called by telephone at the time of interpretation
on 09/25/2017 at [DATE] to the clinical team, who verbally
acknowledged these results.

## 2019-02-02 IMAGING — US US ABDOMEN COMPLETE
1 series · 15 of 25 positions shown · non-contrast
Comparison: CT scan of September 25, 2017.

CLINICAL DATA: Pancreatic cyst.  Third trimester of pregnancy.

EXAM:
ABDOMEN ULTRASOUND COMPLETE

[Series 1: us abdomen complete · 15 of 105 slices shown]
[im 1/105]
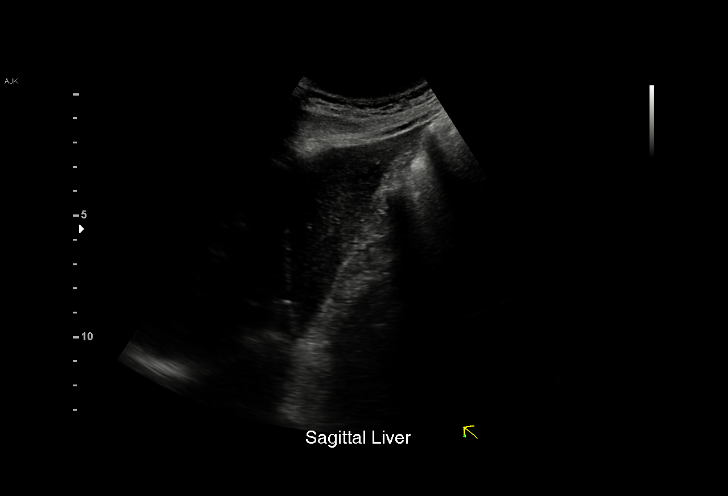
[im 9/105]
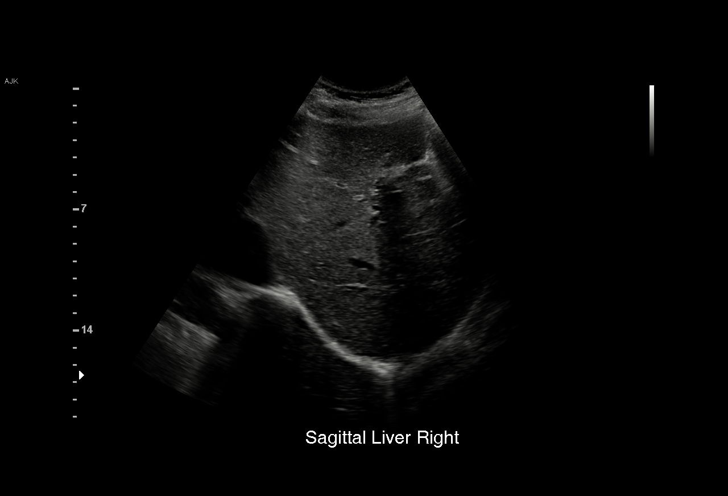
[im 18/105]
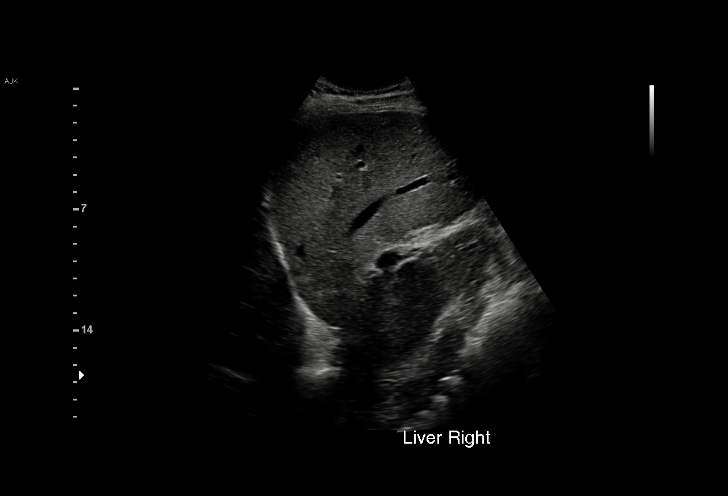
[im 22/105]
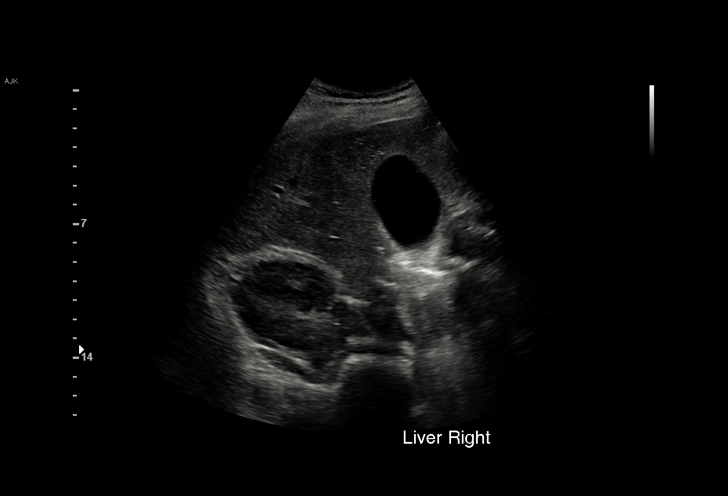
[im 31/105]
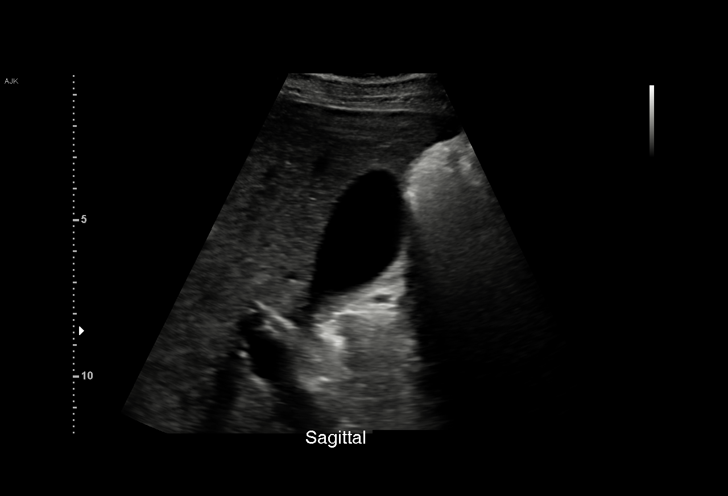
[im 40/105]
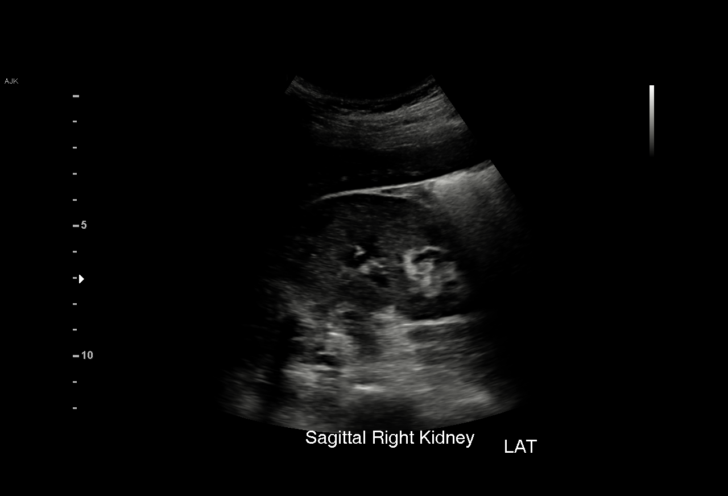
[im 44/105]
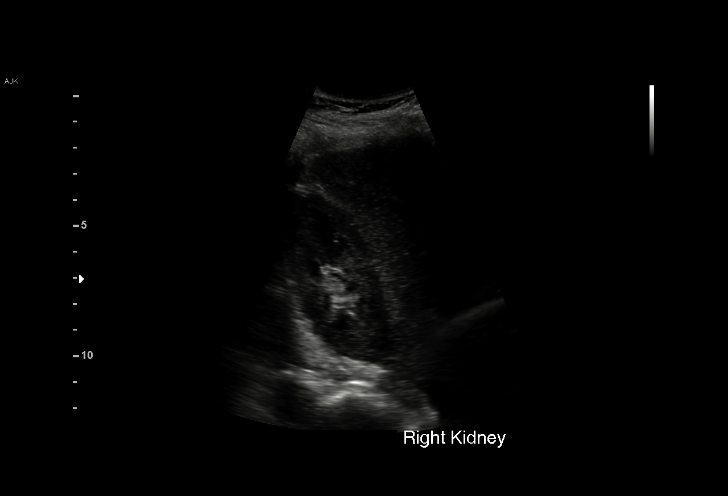
[im 53/105]
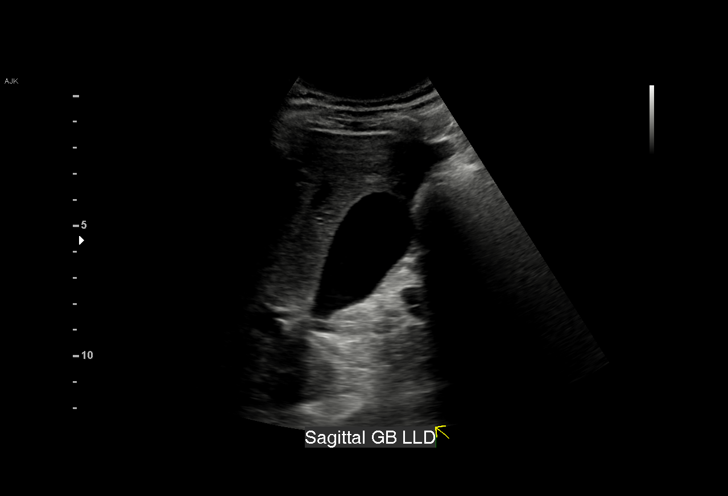
[im 61/105]
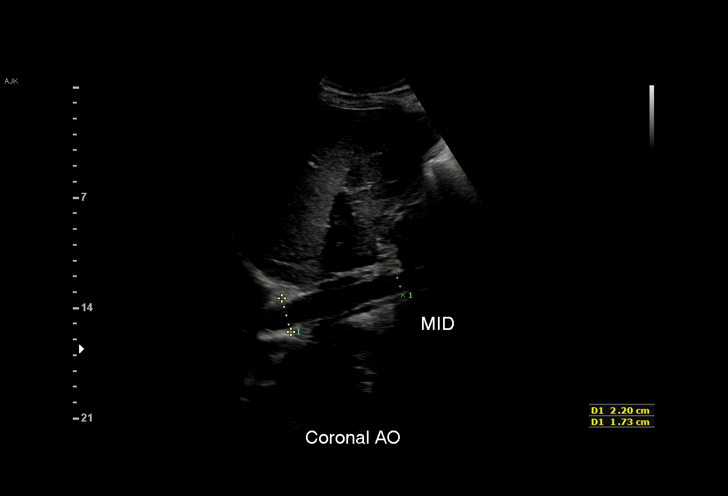
[im 66/105]
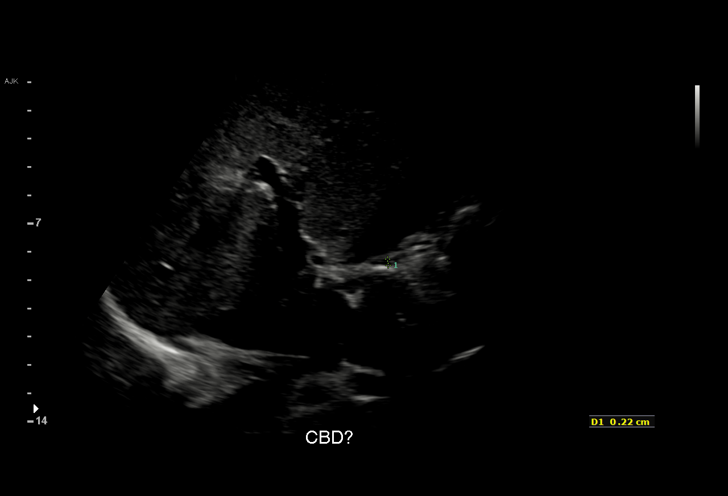
[im 74/105]
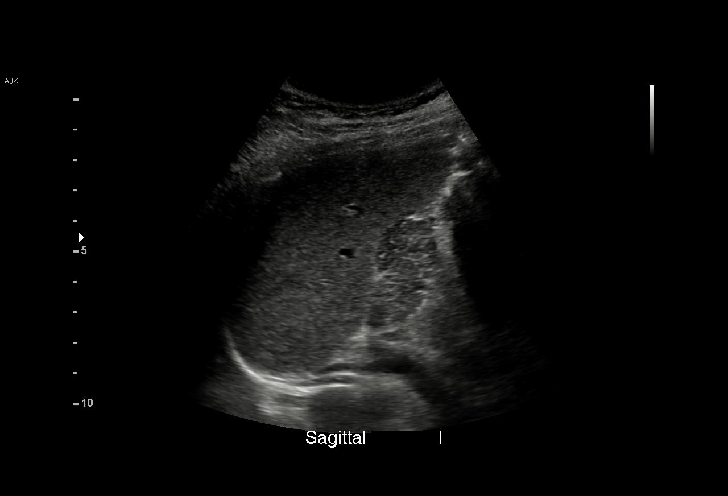
[im 83/105]
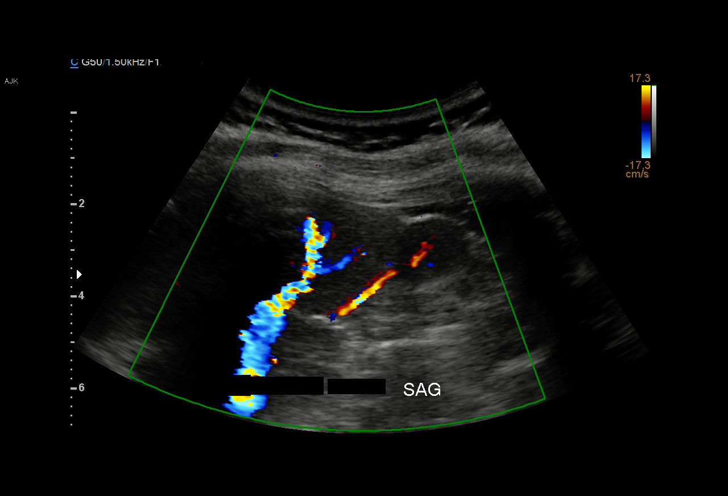
[im 87/105]
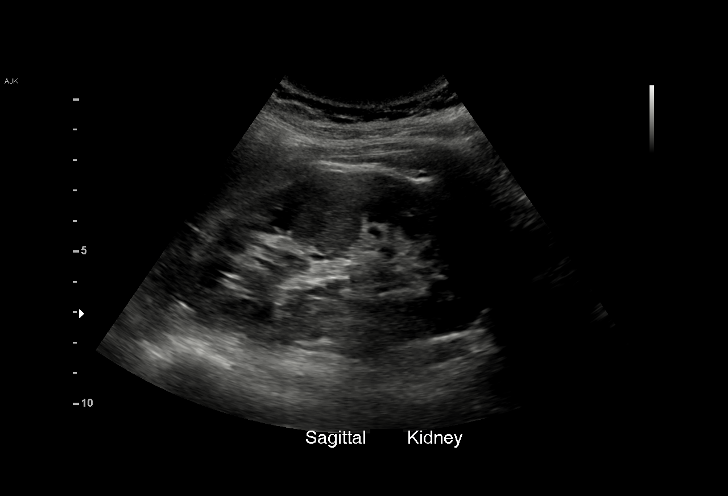
[im 96/105]
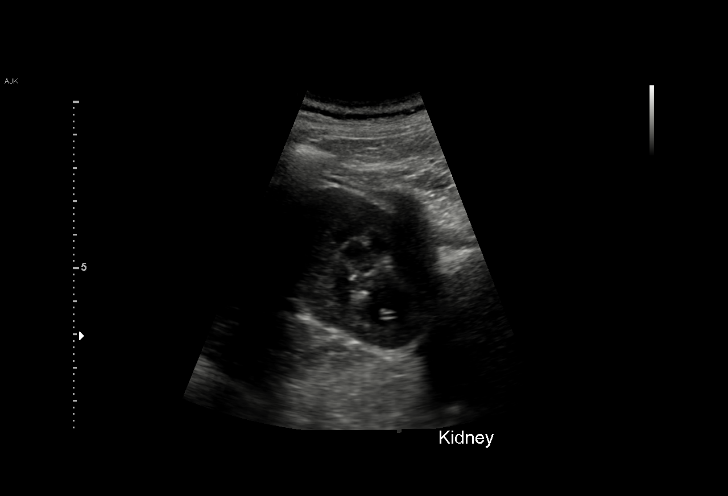
[im 105/105]
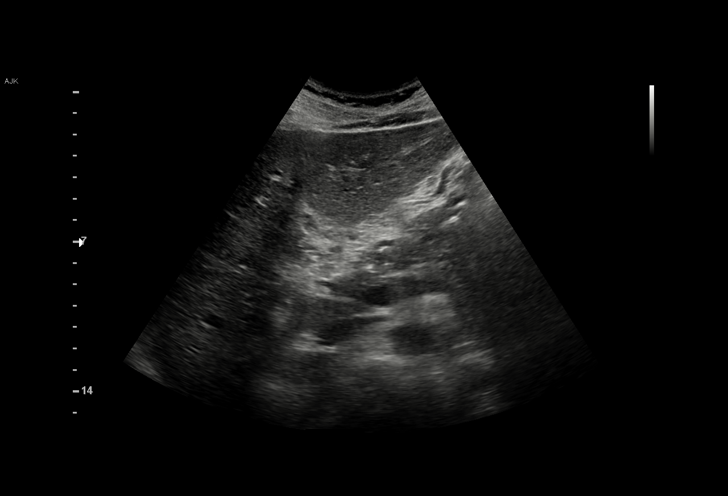

[15 of 25 positions shown; findings below may reference images not displayed]

FINDINGS: Gallbladder: No gallstones or wall thickening visualized. No
sonographic Murphy sign noted by sonographer.

Common bile duct: Diameter: 2.2 mm which is within normal limits.

Liver: No focal lesion identified. Within normal limits in
parenchymal echogenicity. Portal vein is patent on color Doppler
imaging with normal direction of blood flow towards the liver.

IVC: No abnormality visualized.

Pancreas: Not visualized due to pregnancy.

Spleen: Size and appearance within normal limits.

Right Kidney: Length: 11.3 cm. Echogenicity within normal limits. No
mass or hydronephrosis visualized.

Left Kidney: Length: 10.6 cm. Echogenicity within normal limits. No
mass or hydronephrosis visualized.

Abdominal aorta: No aneurysm visualized.

Other findings: None.
IMPRESSION: Pancreas is not visualized on this study due to pregnancy. No other
abnormality seen in the abdomen.

## 2019-02-24 IMAGING — US US MFM OB FOLLOW-UP
1 series · 14 of 28 positions shown · non-contrast
Comparison: none

[Series 1: us mfm ob follow-up · 33 acquisitions, 14 frames shown]
[im 2/33]
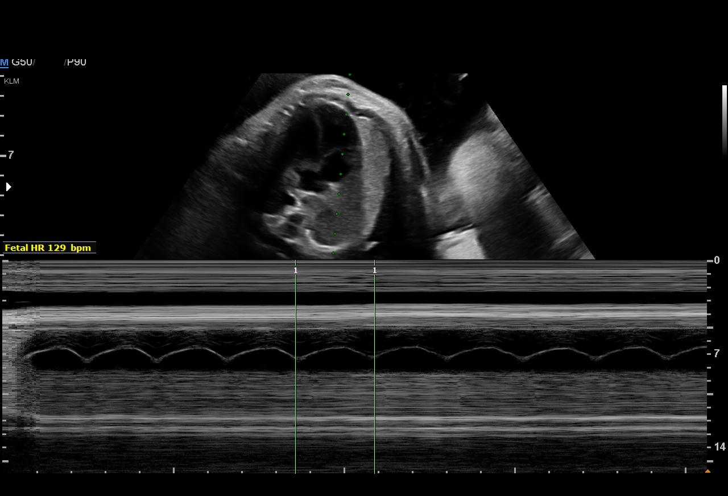
[im 4/33]
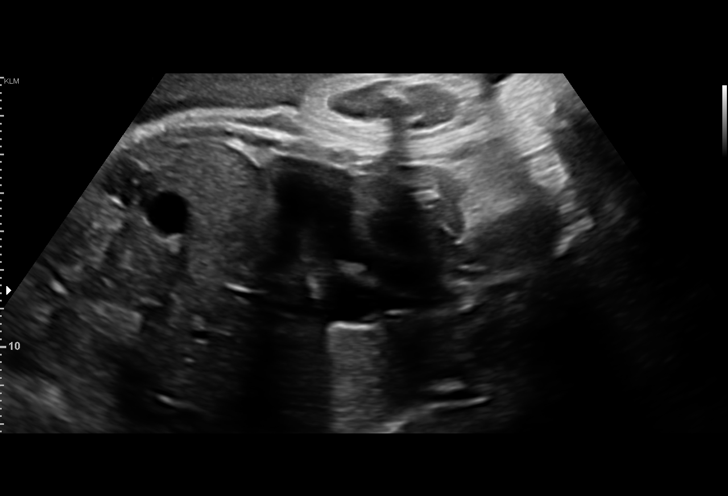
[im 6/33]
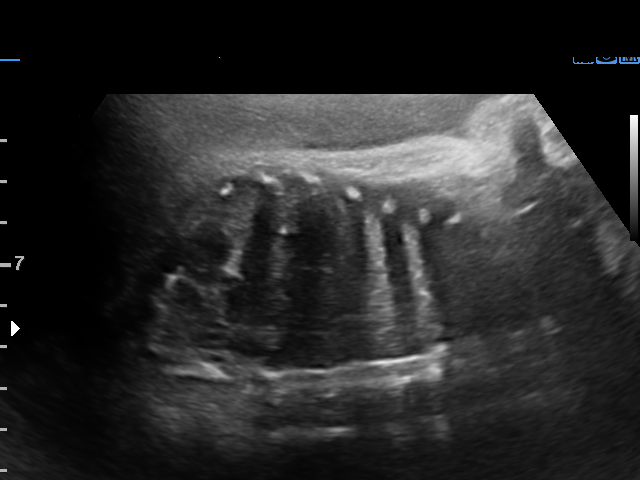
[im 9/33]
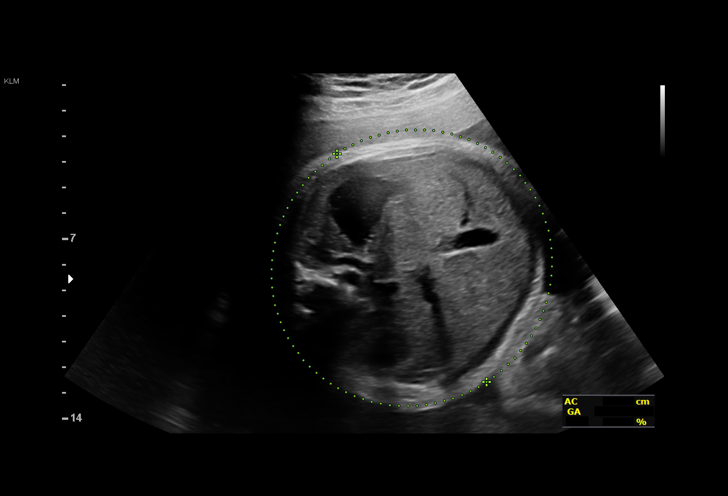
[im 11/33]
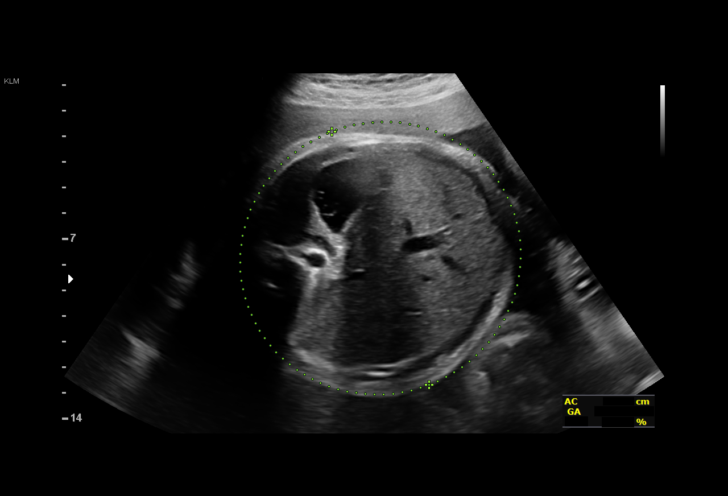
[im 14/33]
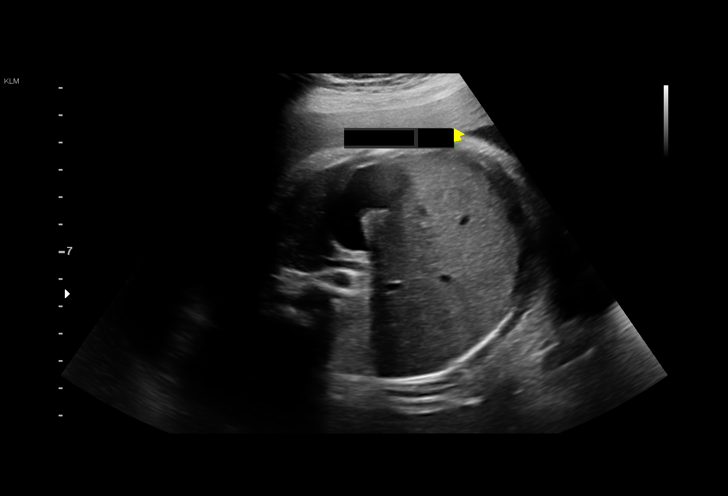
[im 16/33]
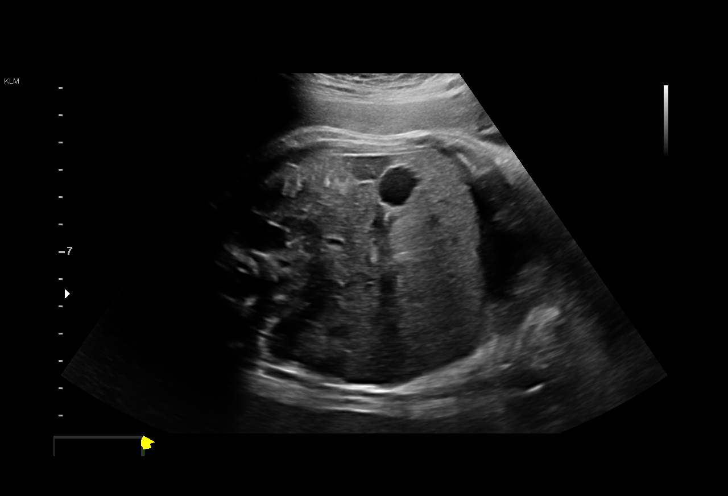
[im 18/33]
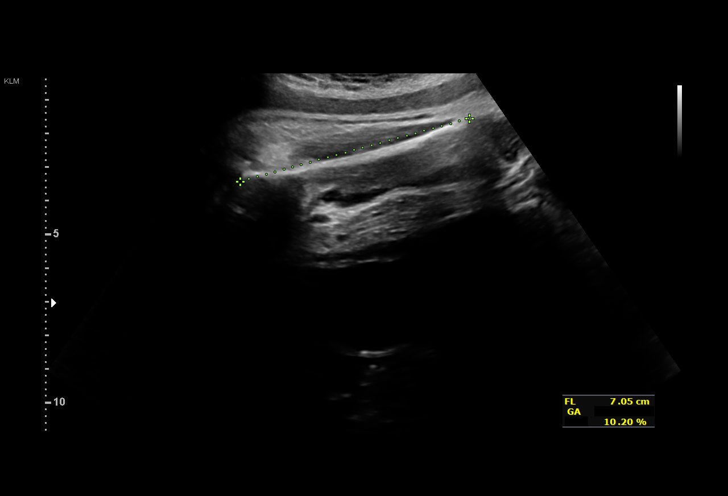
[im 21/33]
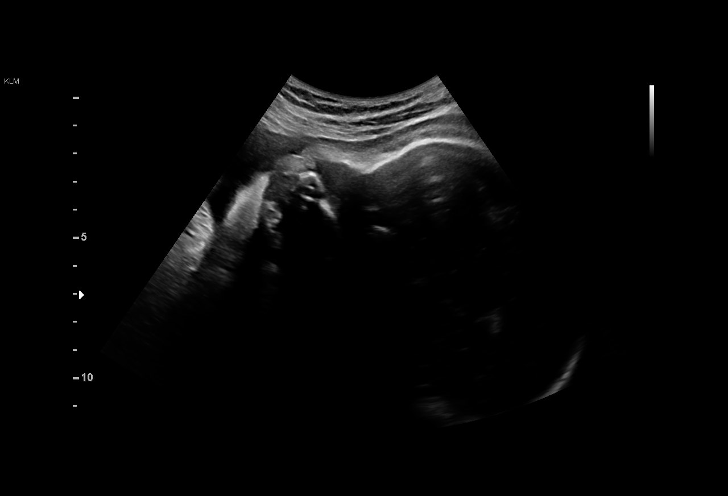
[im 23/33]
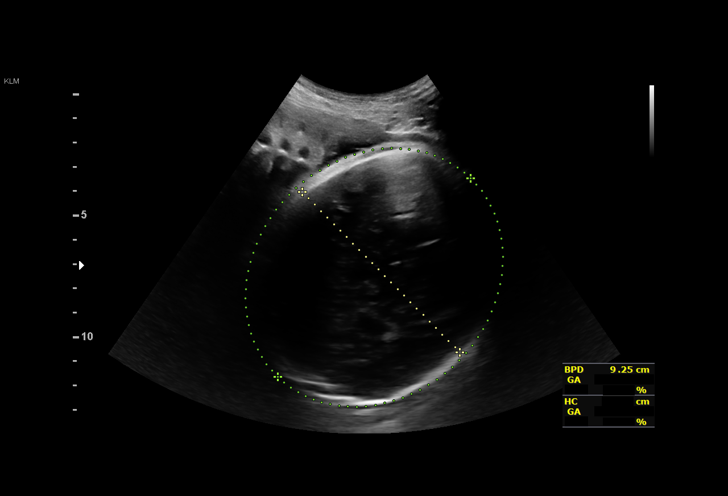
[im 25/33]
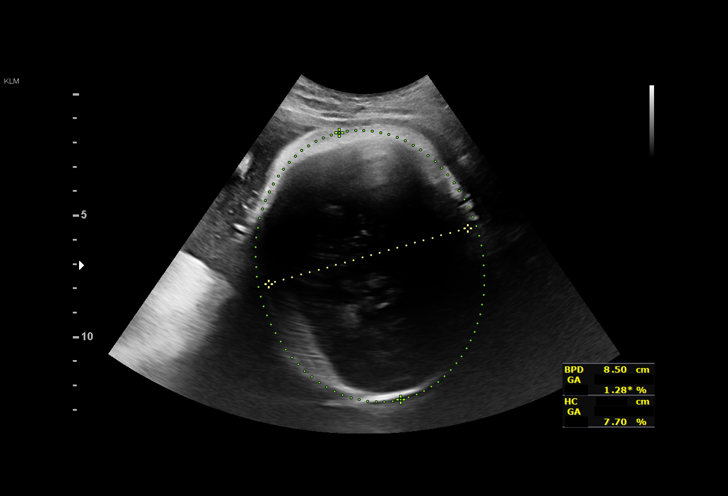
[im 28/33]
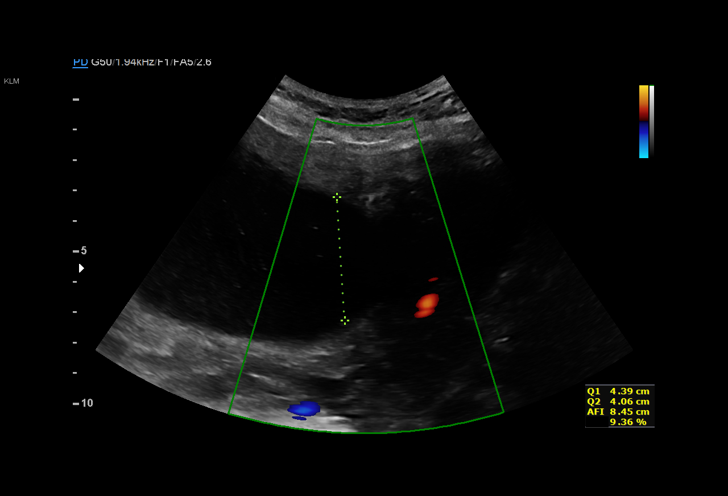
[im 30/33]
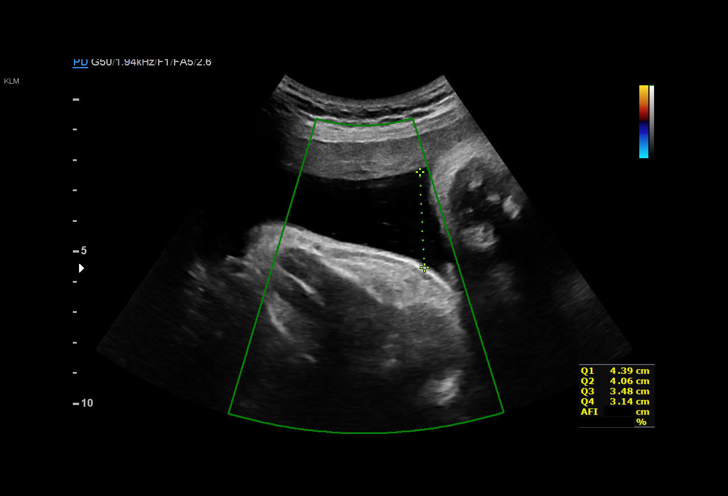
[im 33/33]
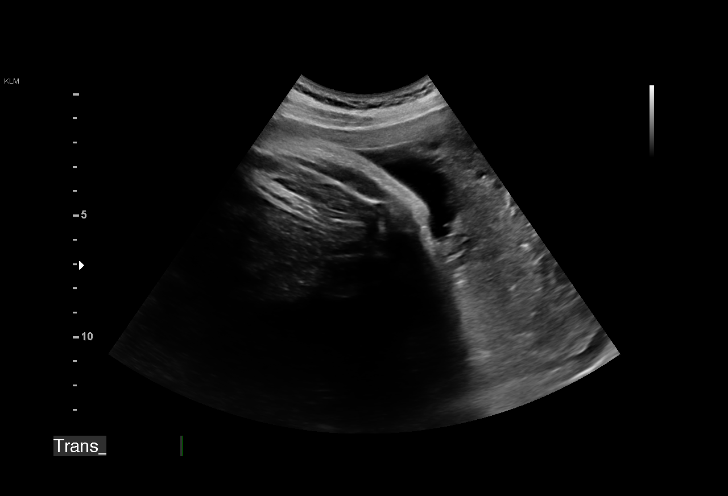

[14 of 28 positions shown; findings below may reference images not displayed]

am)

1  TYESHA CHERUKURI              [id number]      [id number]     [id number]
Indications

38 weeks gestation of pregnancy
Poor obstetric history: Previous fetal growth
restriction (FGR) (4974)
OB History

Blood Type:            Height:  5'5"   Weight (lb):  190       BMI:
Gravidity:    2         Term:   1
Living:       1
Fetal Evaluation

Num Of Fetuses:     1
Fetal Heart         129
Rate(bpm):
Cardiac Activity:   Observed
Presentation:       Cephalic
Placenta:           Posterior

Amniotic Fluid
AFI FV:      Subjectively within normal limits

AFI Sum(cm)     %Tile       Largest Pocket(cm)
15.1            58

RUQ(cm)       RLQ(cm)       LUQ(cm)        LLQ(cm)
4.4
Biometry

BPD:      88.2  mm     G. Age:  35w 5d         12  %    CI:        73.61   %    70 - 86
FL/HC:      21.7   %    20.9 -
HC:      326.6  mm     G. Age:  37w 0d         11  %    HC/AC:      0.96        0.92 -
AC:      338.7  mm     G. Age:  37w 5d         58  %    FL/BPD:     80.3   %    71 - 87
FL:       70.8  mm     G. Age:  36w 2d         13  %    FL/AC:      20.9   %    20 - 24

Est. FW:    0660  gm    6 lb 14 oz      55  %
Gestational Age

LMP:           38w 1d        Date:  03/23/17                 EDD:   12/28/17
U/S Today:     36w 5d                                        EDD:   01/07/18
Best:          38w 1d     Det. By:  LMP  (03/23/17)          EDD:   12/28/17
Anatomy

Cranium:               Appears normal         Aortic Arch:            Previously seen
Cavum:                 Previously seen        Ductal Arch:            Previously seen
Ventricles:            Previously seen        Diaphragm:              Appears normal
Choroid Plexus:        Previously seen        Stomach:                Appears normal, left
sided
Cerebellum:            Previously seen        Abdomen:                Appears normal
Posterior Fossa:       Previously seen        Abdominal Wall:         Previously seen
Nuchal Fold:           Not applicable (>20    Cord Vessels:           Previously seen
wks GA)
Face:                  Orbits and profile     Kidneys:                Appear normal
previously seen
Lips:                  Previously seen        Bladder:                Appears normal
Thoracic:              Appears normal         Spine:                  Previously seen
Heart:                 Appears normal         Upper Extremities:      Previously seen
(4CH, axis, and situs
RVOT:                  Previously seen        Lower Extremities:      Previously seen
LVOT:                  Appears normal

Other:  Female gender. Heels and 5th digit previously visualized. Nasal bone
previously visualized.
Cervix Uterus Adnexa

Cervix
Not visualized (advanced GA >59wks)
Impression

History of IUGR.

Amniotic fluid is normal and good fetal activity is seen. Fetal
growth is appropriate for gestational age.
Recommendations

Follow-up fetal growth assessment in 4 weeks.
xxxxxxxxxxxxxxxxxxxxxxxxxxxxxxxx
ADDENDUM (12/18/17): Follow-up scans as clinically
indicated. Consider delivery at 40 to 41 weeks.

## 2019-03-17 IMAGING — US US ABDOMEN LIMITED
1 series · 15 of 25 positions shown · non-contrast
Comparison: CT chest 09/25/2017.  Abdominal ultrasound 11/23/2017.

CLINICAL DATA: Chest CT 09/25/2017 demonstrated infiltration of fat
about the head and neck of the pancreas. This finding is
incompletely imaged.

EXAM:
ABDOMEN ULTRASOUND COMPLETE

[Series 1: us abdomen limited · 15 of 79 slices shown]
[im 1/79]
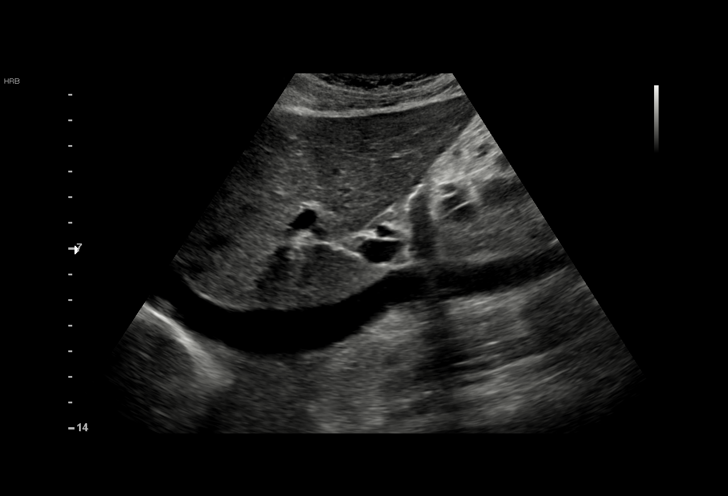
[im 7/79]
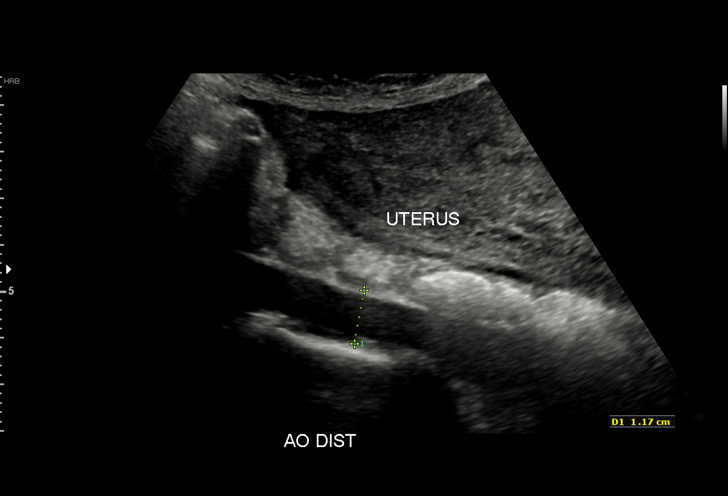
[im 14/79]
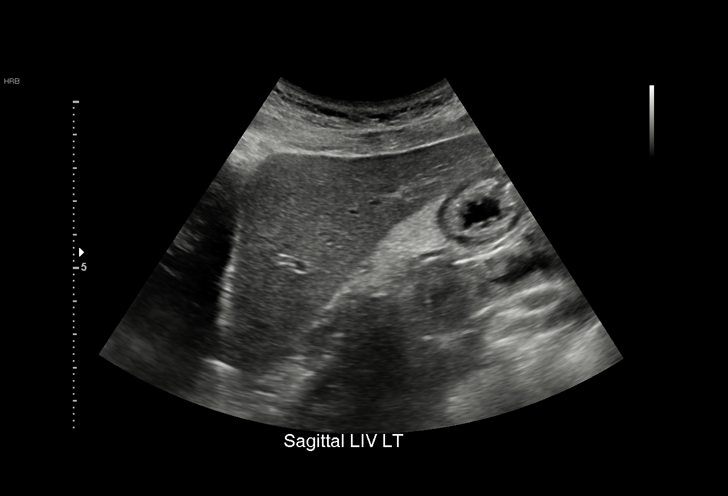
[im 17/79]
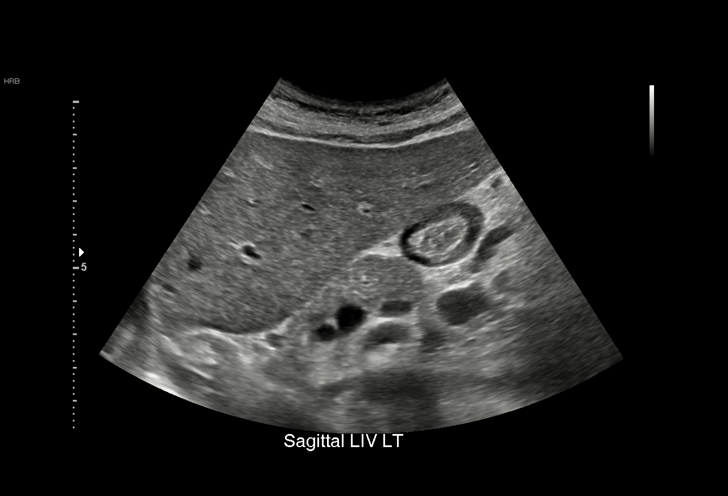
[im 23/79]
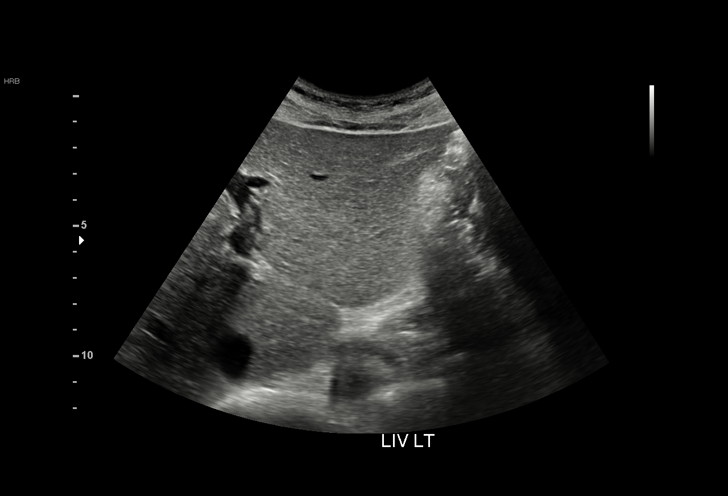
[im 30/79]
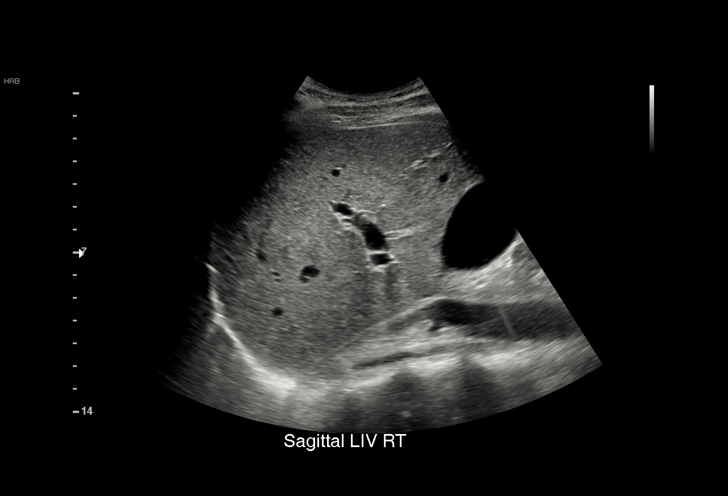
[im 33/79]
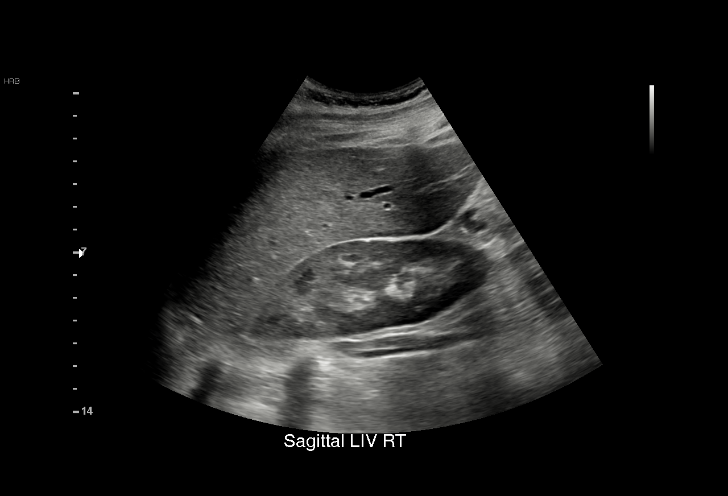
[im 40/79]
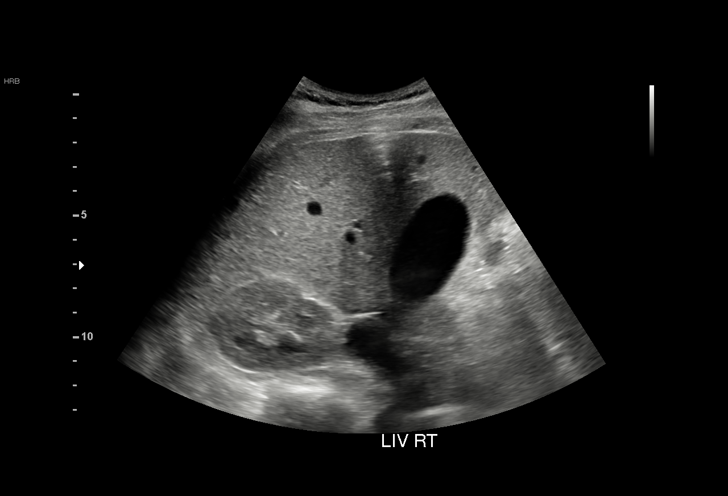
[im 46/79]
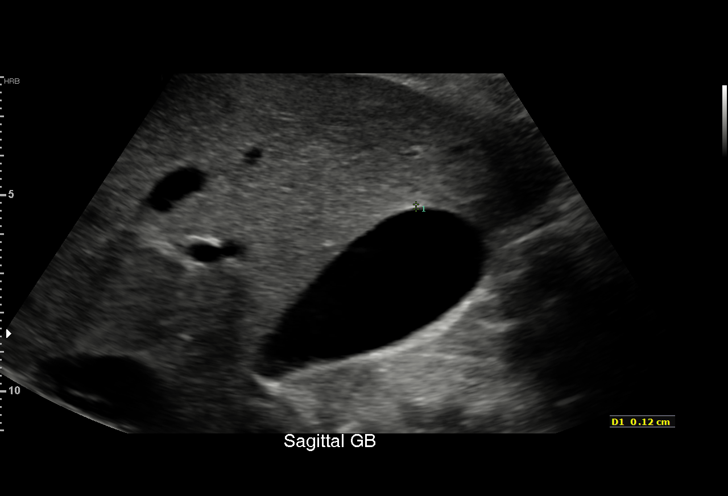
[im 49/79]
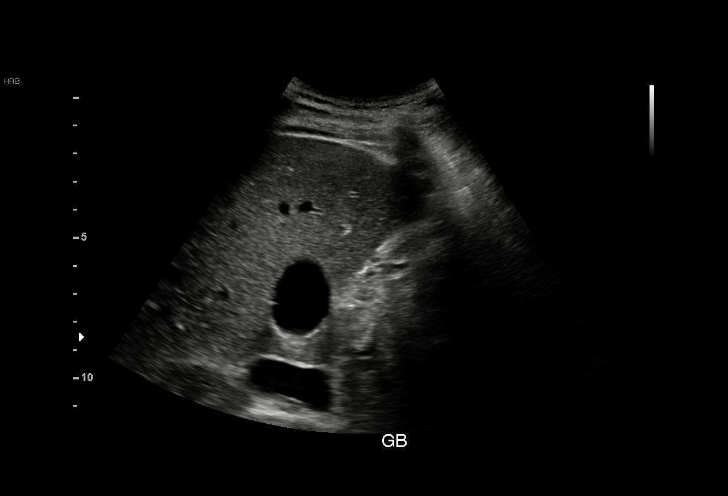
[im 56/79]
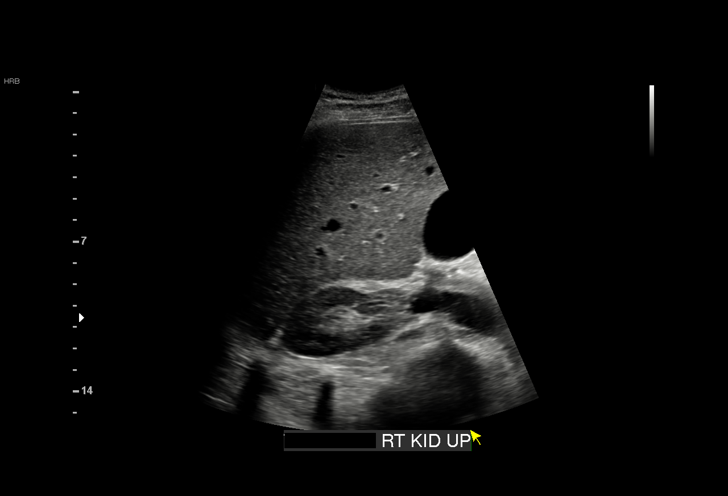
[im 62/79]
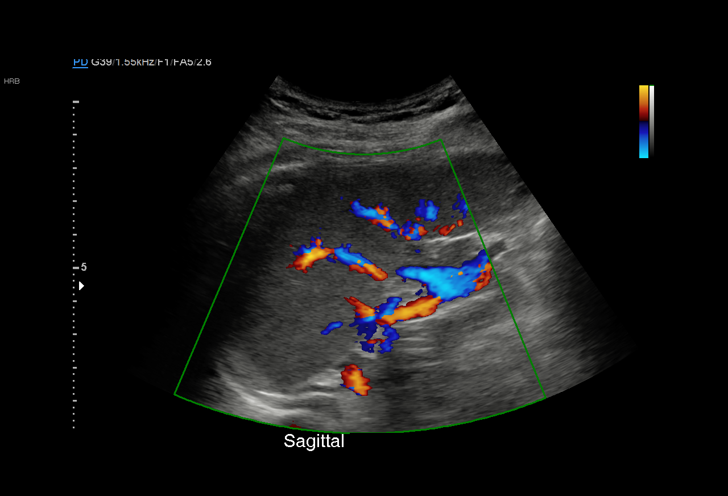
[im 66/79]
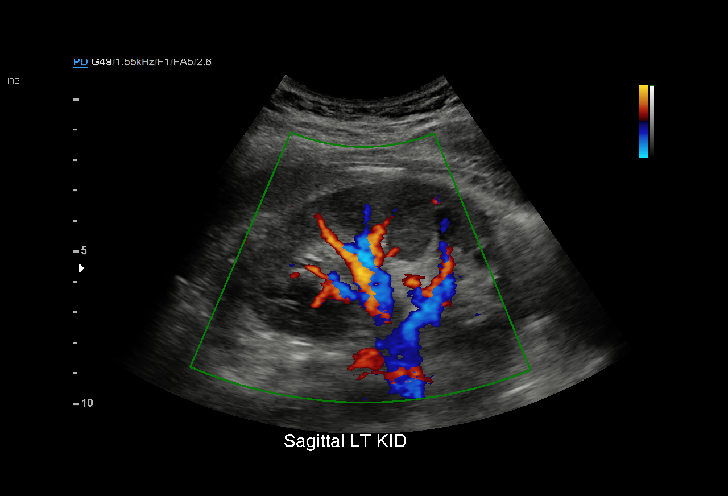
[im 72/79]
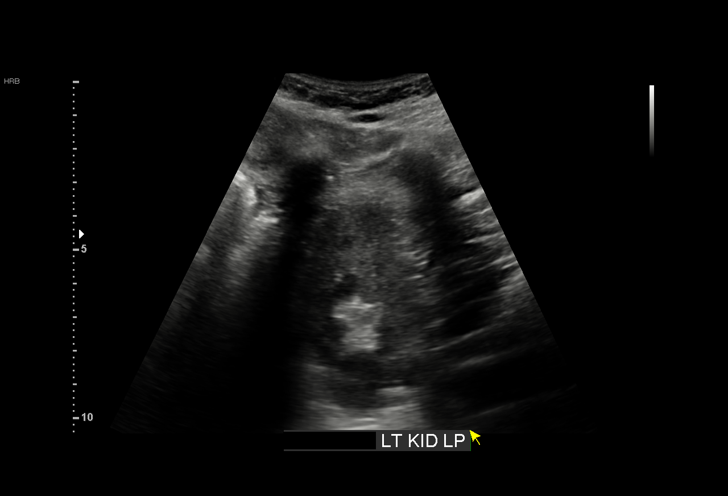
[im 79/79]
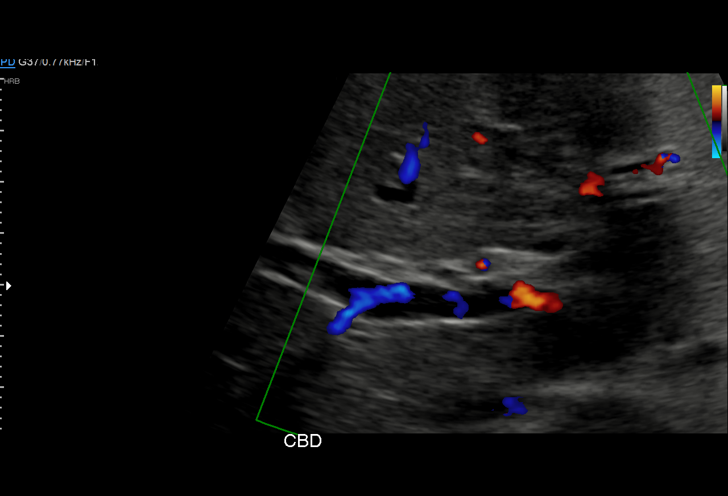

[15 of 25 positions shown; findings below may reference images not displayed]

FINDINGS: Gallbladder: No gallstones or wall thickening visualized. No
sonographic Murphy sign noted by sonographer.

Common bile duct: Diameter: 0.2 cm

Liver: No focal lesion identified. Within normal limits in
parenchymal echogenicity. Portal vein is patent on color Doppler
imaging with normal direction of blood flow towards the liver.

IVC: No abnormality visualized.

Pancreas: Visualized portion unremarkable.

Spleen: Size and appearance within normal limits.

Right Kidney: Length: 11.6 cm. Echogenicity within normal limits. No
mass or hydronephrosis visualized.

Left Kidney: Length: 11.4 cm. Echogenicity within normal limits. No
mass or hydronephrosis visualized.

Abdominal aorta: No aneurysm visualized.

Other findings: None.
IMPRESSION: Normal appearing pancreas.  Normal exam.

## 2021-06-19 ENCOUNTER — Emergency Department (HOSPITAL_COMMUNITY): Payer: No Typology Code available for payment source

## 2021-06-19 ENCOUNTER — Encounter (HOSPITAL_COMMUNITY): Payer: Self-pay | Admitting: Emergency Medicine

## 2021-06-19 ENCOUNTER — Emergency Department (HOSPITAL_COMMUNITY)
Admission: EM | Admit: 2021-06-19 | Discharge: 2021-06-19 | Disposition: A | Discharge status: Home / Self Care | Payer: No Typology Code available for payment source | Attending: Emergency Medicine | Admitting: Emergency Medicine

## 2021-06-19 ENCOUNTER — Other Ambulatory Visit: Payer: Self-pay

## 2021-06-19 DIAGNOSIS — N9489 Other specified conditions associated with female genital organs and menstrual cycle: Secondary | ICD-10-CM | POA: Diagnosis not present

## 2021-06-19 DIAGNOSIS — N201 Calculus of ureter: Secondary | ICD-10-CM | POA: Diagnosis not present

## 2021-06-19 DIAGNOSIS — R109 Unspecified abdominal pain: Secondary | ICD-10-CM | POA: Diagnosis present

## 2021-06-19 LAB — URINALYSIS, ROUTINE W REFLEX MICROSCOPIC
Bilirubin Urine: NEGATIVE
Glucose, UA: NEGATIVE mg/dL
Ketones, ur: NEGATIVE mg/dL
Leukocytes,Ua: NEGATIVE
Nitrite: NEGATIVE
Protein, ur: NEGATIVE mg/dL
Specific Gravity, Urine: >1.03 — ABNORMAL HIGH (ref 1.005–1.030)
pH: 5.5 (ref 5.0–8.0)

## 2021-06-19 LAB — CBC WITH DIFFERENTIAL/PLATELET
Abs Immature Granulocytes: 0.01 10*3/uL (ref 0.00–0.07)
Basophils Absolute: 0 10*3/uL (ref 0.0–0.1)
Basophils Relative: 1 %
Eosinophils Absolute: 0.1 10*3/uL (ref 0.0–0.5)
Eosinophils Relative: 1 %
HCT: 40.4 % (ref 36.0–46.0)
Hemoglobin: 13.8 g/dL (ref 12.0–15.0)
Immature Granulocytes: 0 %
Lymphocytes Relative: 51 %
Lymphs Abs: 2.9 10*3/uL (ref 0.7–4.0)
MCH: 30.9 pg (ref 26.0–34.0)
MCHC: 34.2 g/dL (ref 30.0–36.0)
MCV: 90.4 fL (ref 80.0–100.0)
Monocytes Absolute: 0.5 10*3/uL (ref 0.1–1.0)
Monocytes Relative: 8 %
Neutro Abs: 2.2 10*3/uL (ref 1.7–7.7)
Neutrophils Relative %: 39 %
Platelets: 338 10*3/uL (ref 150–400)
RBC: 4.47 MIL/uL (ref 3.87–5.11)
RDW: 13.3 % (ref 11.5–15.5)
WBC: 5.6 10*3/uL (ref 4.0–10.5)
nRBC: 0 % (ref 0.0–0.2)

## 2021-06-19 LAB — I-STAT BETA HCG BLOOD, ED (MC, WL, AP ONLY): I-stat hCG, quantitative: <5 m[IU]/mL (ref <5)

## 2021-06-19 LAB — COMPREHENSIVE METABOLIC PANEL
ALT: 15 U/L (ref 0–44)
AST: 15 U/L (ref 15–41)
Albumin: 3.6 g/dL (ref 3.5–5.0)
Alkaline Phosphatase: 34 U/L — ABNORMAL LOW (ref 38–126)
Anion gap: 7 (ref 5–15)
BUN: 11 mg/dL (ref 6–20)
CO2: 23 mmol/L (ref 22–32)
Calcium: 9.2 mg/dL (ref 8.9–10.3)
Chloride: 108 mmol/L (ref 98–111)
Creatinine, Ser: 0.91 mg/dL (ref 0.44–1.00)
GFR, Estimated: >60 mL/min (ref >60)
Glucose, Bld: 128 mg/dL — ABNORMAL HIGH (ref 70–99)
Potassium: 3.2 mmol/L — ABNORMAL LOW (ref 3.5–5.1)
Sodium: 138 mmol/L (ref 135–145)
Total Bilirubin: 0.4 mg/dL (ref 0.3–1.2)
Total Protein: 7 g/dL (ref 6.5–8.1)

## 2021-06-19 LAB — URINALYSIS, MICROSCOPIC (REFLEX)

## 2021-06-19 MED ORDER — OXYCODONE-ACETAMINOPHEN 5-325 MG PO TABS
1.0000 | ORAL_TABLET | Freq: Once | ORAL | Status: AC
  Administered 2021-06-19: 1 via ORAL
  Filled 2021-06-19: qty 1

## 2021-06-19 MED ORDER — ONDANSETRON 4 MG PO TBDP
4.0000 mg | ORAL_TABLET | Freq: Once | ORAL | Status: AC
  Administered 2021-06-19: 4 mg via ORAL
  Filled 2021-06-19: qty 1

## 2021-06-19 MED ORDER — KETOROLAC TROMETHAMINE 15 MG/ML IJ SOLN
15.0000 mg | Freq: Once | INTRAMUSCULAR | Status: DC
  Filled 2021-06-19: qty 1

## 2021-06-19 NOTE — ED Triage Notes (Signed)
Patient reports right flank pain radiating to RLQ abdominal pain with nausea onset this evening .

## 2021-06-19 NOTE — ED Provider Notes (Signed)
[facility] EMERGENCY DEPARTMENT Provider Note   CSN: [id number] Arrival date & time: 06/19/21  6237     History  Chief Complaint  Patient presents with   Flank / Abdominal Pain     Francine Hannan is a 31 y.o. female with family history of kidney stones who presents to the emergency department with right-sided flank pain that radiates into the right lower quadrant abdomen started around 0130 that this morning.  Patient describes his pain as a sharp stabbing sensation that is episodic in nature.  She reports associated urinary urgency and frequency, nausea, and chills.  She does have a history of back pain but she states this feels different.  No fever, diarrhea, chest pain, shortness of breath. Pain has improved since initially being seen in triage and she currently rates it a 5/10 in severity.   HPI     Home Medications Prior to Admission medications   Medication Sig Start Date End Date Taking? Authorizing Provider  naproxen (NAPROSYN) 375 MG tablet Take 1 tablet twice daily as needed for pain. 03/16/18   Molpus, John, MD      Allergies    Patient has no known allergies.    Review of Systems   Review of Systems  All other systems reviewed and are negative.  Physical Exam Updated Vital Signs BP 112/78    Pulse 61    Temp 98.2 F (36.8 C) (Oral)    Resp 15    Ht 5\' 3"  (1.6 m)    Wt 80 kg    SpO2 99%    BMI 31.24 kg/m  Physical Exam Vitals and nursing note reviewed.  Constitutional:      General: She is not in acute distress.    Appearance: Normal appearance.  HENT:     Head: Normocephalic and atraumatic.  Eyes:     General:        Right eye: No discharge.        Left eye: No discharge.  Cardiovascular:     Comments: Regular rate and rhythm.  S1/S2 are distinct without any evidence of murmur, rubs, or gallops.  Radial pulses are 2+ bilaterally.  Dorsalis pedis pulses are 2+ bilaterally.  No evidence of pedal edema. Pulmonary:     Comments:  Clear to auscultation bilaterally.  Normal effort.  No respiratory distress.  No evidence of wheezes, rales, or rhonchi heard throughout. Abdominal:     General: Abdomen is flat. Bowel sounds are normal. There is no distension.     Tenderness: There is abdominal tenderness in the right lower quadrant. There is no right CVA tenderness, left CVA tenderness, guarding or rebound.  Musculoskeletal:        General: Normal range of motion.     Cervical back: Neck supple.  Skin:    General: Skin is warm and dry.     Findings: No rash.  Neurological:     General: No focal deficit present.     Mental Status: She is alert.  Psychiatric:        Mood and Affect: Mood normal.        Behavior: Behavior normal.    ED Results / Procedures / Treatments   Labs (all labs ordered are listed, but only abnormal results are displayed) Labs Reviewed  COMPREHENSIVE METABOLIC PANEL - Abnormal; Notable for the following components:      Result Value   Potassium 3.2 (*)    Glucose, Bld 128 (*)    Alkaline Phosphatase  34 (*)    All other components within normal limits  URINALYSIS, ROUTINE W REFLEX MICROSCOPIC - Abnormal; Notable for the following components:   Specific Gravity, Urine >1.030 (*)    Hgb urine dipstick LARGE (*)    All other components within normal limits  URINALYSIS, MICROSCOPIC (REFLEX) - Abnormal; Notable for the following components:   Bacteria, UA RARE (*)    All other components within normal limits  CBC WITH DIFFERENTIAL/PLATELET  I-STAT BETA HCG BLOOD, ED ([facility], [facility], [facility] ONLY)    EKG None  Radiology CT Renal Stone Study  Result Date: 06/19/2021 CLINICAL DATA:  Right flank pain in. EXAM: CT ABDOMEN AND PELVIS WITHOUT CONTRAST TECHNIQUE: Multidetector CT imaging of the abdomen and pelvis was performed following the standard protocol without IV contrast. RADIATION DOSE REDUCTION: This exam was performed according to the departmental dose-optimization program which includes automated  exposure control, adjustment of the mA and/or kV according to patient size and/or use of iterative reconstruction technique. COMPARISON:  None. FINDINGS: Lower chest: No acute abnormality. Hepatobiliary: No focal liver abnormality is seen. No gallstones, gallbladder wall thickening, or biliary dilatation. Pancreas: Unremarkable. No pancreatic ductal dilatation or surrounding inflammatory changes. Spleen: Normal in size without focal abnormality. Adrenals/Urinary Tract: Adrenal glands are unremarkable. The kidneys are normal in size, without focal lesions. A 3 mm calcification is seen along the expected region of the distal right ureter, near the right UVJ (axial CT image 73, CT series 3). Mild right-sided hydronephrosis and hydroureter are also noted. The urinary bladder is poorly distended and subsequently limited in evaluation. Stomach/Bowel: Stomach is within normal limits. Appendix appears normal. No evidence of bowel wall thickening, distention, or inflammatory changes. Vascular/Lymphatic: No significant vascular findings are present. No enlarged abdominal or pelvic lymph nodes. Reproductive: A 4.4 cm x 4.2 cm exophytic uterine fibroid is suspected along the anterolateral aspect of the uterine fundus on the right. The bilateral adnexa are unremarkable. Other: No abdominal wall hernia or abnormality. A 4.4 cm x 3.3 cm x 4.8 cm mildly lobulated area of fat attenuation (approximately - 10.33 Hounsfield units) is seen within the right upper quadrant. This is adjacent to the inferior medial aspect of the gallbladder and anterior aspect of the third part of the duodenum. No abdominopelvic ascites. Musculoskeletal: No acute or significant osseous findings. IMPRESSION: 1. 3 mm renal calculus along the expected region of the distal right ureter, near the right UVJ. 2. 4.4 cm x 3.3 cm x 4.8 cm mildly lobulated area of fat attenuation within the right upper quadrant, as described above. This may represent a lipoma. 3.  Suspected uterine fibroid. Correlation with nonemergent pelvic ultrasound is recommended. Electronically Signed   By: Aram Candela M.D.   On: 06/19/2021 04:09    Procedures Procedures  Normal sinus rhythm and regular rate.  She is 98% on room air  with normal respirations.  Medications Ordered in ED Medications  ondansetron (ZOFRAN-ODT) disintegrating tablet 4 mg (4 mg Oral Given 06/19/21 0302)  oxyCODONE-acetaminophen (PERCOCET/ROXICET) 5-325 MG per tablet 1 tablet (1 tablet Oral Given 06/19/21 0302)  oxyCODONE-acetaminophen (PERCOCET/ROXICET) 5-325 MG per tablet 1 tablet (1 tablet Oral Given 06/19/21 1012)    ED Course/ Medical Decision Making/ A&P                           Medical Decision Making  Amiee Wiley is a 31 y.o. female with no significant comorbidities affecting her care who presents to the emergency  department today for evaluation of right flank plain and right lower quadrant abdominal pain.  Differential diagnosis includes pyelonephritis, cystitis, appendicitis, ureterolithiasis.  Patient does not have any fever or vomiting and does not necessarily meet criteria for pyelonephritis at this time.  Initial work-up was ordered in triage interpreted by myself.  CBC was without any evidence of leukocytosis or anemia.  Given that this abdominal pain is improving without evidence of fever or leukocytosis I do believe appendicitis is less likely at this time.  CMP did not reveal any significant abnormalities apart from a mild hypokalemia.  She was negative for pregnancy.  Urinalysis did show a large amount of blood which is somewhat consistent with either cystitis or ureterolithiasis.  There was no signs of infection making cystitis and pyelonephritis less likely.  Imaging did reveal a 3 mm stone at the right UVJ.  This will likely pass on its own. Appendix did appear normal. I agree with the radiologist interpretation.   Patient had a Percocet given in the waiting room prior to  me seeing her which has improved her pain.  She is currently resting comfortably.  I will provide her another round of Percocet to go home with.  I discussed the findings with the patient at bedside and answered all questions.  Patient is comfortable handling this at home.  We discussed appropriate pain management and drinking plenty of fluids.  Strict return precautions were given.  Overall, she is much improved since she got here.  Vital signs have normalized.  She has no significant social determinants to impact her care moving forward.  Given the clinical scenario I do believe she would benefit from further evaluation in the outpatient setting.  She does not currently have a primary care doctor but says she can establish with 1 relatively quickly.  We discussed this is necessary given that she does have a strong family history of kidney stones and with her first case of she is likely to have more in the future.  If these become more frequent she may need referral to urology.  Patient was amenable with this plan.  We discussed using ibuprofen for pain control moving forward.  She was amenable this plan.  She is safe for discharge.  Final Clinical Impression(s) / ED Diagnoses Final diagnoses:  Ureterolithiasis    Rx / DC Orders ED Discharge Orders     None         Jolyn LentFleming, [place] M, PA-C 06/19/21 1021    Jacalyn LefevreHaviland, Julie, MD 06/19/21 1024

## 2021-06-19 NOTE — Discharge Instructions (Signed)
Please take 600 mg of ibuprofen every 6 hours as needed for pain.  Please establish a primary care doctor moving forward.  Please return to the emergency department for worsening symptoms.

## 2021-06-19 NOTE — ED Provider Triage Note (Signed)
Emergency Medicine Provider Triage Evaluation Note  Beth Morrow , a 31 y.o. female  was evaluated in triage.  Pt complains of right-sided flank and abdominal pain.  Patient states pain began about an hour prior to arrival.  Began suddenly and is severe.  Associated nausea.  No fevers or chills.  No urinary symptoms or abnormal bowel movements.  No history of similar.  No previous abdominal surgeries.  Review of Systems  Positive: R sided flank and abd pain Negative: fever  Physical Exam  BP (!) 158/86    Pulse (!) 58    Temp (!) 97.5 F (36.4 C) (Oral)    Resp (!) 22    Ht 5\' 3"  (1.6 m)    Wt 80 kg    SpO2 100%    BMI 31.24 kg/m  Gen:   Awake, no distress   Resp:  Normal effort  MSK:   Moves extremities without difficulty  Other:  Tenderness palpation of right CVA and right lower quadrant abdomen.  Medical Decision Making  Medically screening exam initiated at 2:51 AM.  Appropriate orders placed.  Beth Morrow was informed that the remainder of the evaluation will be completed by another provider, this initial triage assessment does not replace that evaluation, and the importance of remaining in the ED until their evaluation is complete.  Labs, ct renal, ua, meds   Harlene Salts, PA-C 06/19/21 06/21/21

## 2021-06-23 ENCOUNTER — Emergency Department (HOSPITAL_BASED_OUTPATIENT_CLINIC_OR_DEPARTMENT_OTHER)
Admission: EM | Admit: 2021-06-23 | Discharge: 2021-06-23 | Disposition: A | Discharge status: Home / Self Care | Payer: No Typology Code available for payment source | Attending: Emergency Medicine | Admitting: Emergency Medicine

## 2021-06-23 ENCOUNTER — Encounter (HOSPITAL_BASED_OUTPATIENT_CLINIC_OR_DEPARTMENT_OTHER): Payer: Self-pay

## 2021-06-23 ENCOUNTER — Other Ambulatory Visit (HOSPITAL_BASED_OUTPATIENT_CLINIC_OR_DEPARTMENT_OTHER): Payer: Self-pay

## 2021-06-23 ENCOUNTER — Other Ambulatory Visit: Payer: Self-pay

## 2021-06-23 DIAGNOSIS — R110 Nausea: Secondary | ICD-10-CM | POA: Insufficient documentation

## 2021-06-23 DIAGNOSIS — Z20822 Contact with and (suspected) exposure to covid-19: Secondary | ICD-10-CM | POA: Insufficient documentation

## 2021-06-23 DIAGNOSIS — R630 Anorexia: Secondary | ICD-10-CM | POA: Insufficient documentation

## 2021-06-23 DIAGNOSIS — E860 Dehydration: Secondary | ICD-10-CM | POA: Insufficient documentation

## 2021-06-23 DIAGNOSIS — R109 Unspecified abdominal pain: Secondary | ICD-10-CM | POA: Diagnosis present

## 2021-06-23 DIAGNOSIS — N390 Urinary tract infection, site not specified: Secondary | ICD-10-CM | POA: Insufficient documentation

## 2021-06-23 HISTORY — DX: Calculus of kidney: N20.0

## 2021-06-23 LAB — URINALYSIS, ROUTINE W REFLEX MICROSCOPIC
Bilirubin Urine: NEGATIVE
Glucose, UA: NEGATIVE mg/dL
Ketones, ur: 15 mg/dL — AB
Nitrite: NEGATIVE
Protein, ur: 30 mg/dL — AB
Specific Gravity, Urine: 1.022 (ref 1.005–1.030)
pH: 6 (ref 5.0–8.0)

## 2021-06-23 LAB — CBC WITH DIFFERENTIAL/PLATELET
Abs Immature Granulocytes: 0.02 10*3/uL (ref 0.00–0.07)
Basophils Absolute: 0 10*3/uL (ref 0.0–0.1)
Basophils Relative: 0 %
Eosinophils Absolute: 0 10*3/uL (ref 0.0–0.5)
Eosinophils Relative: 0 %
HCT: 39.5 % (ref 36.0–46.0)
Hemoglobin: 13.5 g/dL (ref 12.0–15.0)
Immature Granulocytes: 0 %
Lymphocytes Relative: 16 %
Lymphs Abs: 1.5 10*3/uL (ref 0.7–4.0)
MCH: 30.1 pg (ref 26.0–34.0)
MCHC: 34.2 g/dL (ref 30.0–36.0)
MCV: 88.2 fL (ref 80.0–100.0)
Monocytes Absolute: 1.3 10*3/uL — ABNORMAL HIGH (ref 0.1–1.0)
Monocytes Relative: 14 %
Neutro Abs: 6.2 10*3/uL (ref 1.7–7.7)
Neutrophils Relative %: 70 %
Platelets: 329 10*3/uL (ref 150–400)
RBC: 4.48 MIL/uL (ref 3.87–5.11)
RDW: 12.9 % (ref 11.5–15.5)
WBC: 9 10*3/uL (ref 4.0–10.5)
nRBC: 0 % (ref 0.0–0.2)

## 2021-06-23 LAB — COMPREHENSIVE METABOLIC PANEL
ALT: 9 U/L (ref 0–44)
AST: 12 U/L — ABNORMAL LOW (ref 15–41)
Albumin: 4.1 g/dL (ref 3.5–5.0)
Alkaline Phosphatase: 35 U/L — ABNORMAL LOW (ref 38–126)
Anion gap: 10 (ref 5–15)
BUN: 8 mg/dL (ref 6–20)
CO2: 24 mmol/L (ref 22–32)
Calcium: 9.8 mg/dL (ref 8.9–10.3)
Chloride: 99 mmol/L (ref 98–111)
Creatinine, Ser: 0.86 mg/dL (ref 0.44–1.00)
GFR, Estimated: >60 mL/min (ref >60)
Glucose, Bld: 100 mg/dL — ABNORMAL HIGH (ref 70–99)
Potassium: 3.8 mmol/L (ref 3.5–5.1)
Sodium: 133 mmol/L — ABNORMAL LOW (ref 135–145)
Total Bilirubin: 0.7 mg/dL (ref 0.3–1.2)
Total Protein: 7.9 g/dL (ref 6.5–8.1)

## 2021-06-23 LAB — RESP PANEL BY RT-PCR (FLU A&B, COVID) ARPGX2
Influenza A by PCR: NEGATIVE
Influenza B by PCR: NEGATIVE
SARS Coronavirus 2 by RT PCR: NEGATIVE

## 2021-06-23 LAB — PREGNANCY, URINE: Preg Test, Ur: NEGATIVE

## 2021-06-23 MED ORDER — CEFTRIAXONE SODIUM 1 G IJ SOLR
1.0000 g | Freq: Once | INTRAMUSCULAR | Status: AC
  Administered 2021-06-23: 1 g via INTRAVENOUS
  Filled 2021-06-23: qty 10

## 2021-06-23 MED ORDER — ACETAMINOPHEN 325 MG PO TABS
650.0000 mg | ORAL_TABLET | Freq: Once | ORAL | Status: AC
  Administered 2021-06-23: 650 mg via ORAL
  Filled 2021-06-23: qty 2

## 2021-06-23 MED ORDER — KETOROLAC TROMETHAMINE 15 MG/ML IJ SOLN
15.0000 mg | Freq: Once | INTRAMUSCULAR | Status: AC
Start: 2021-06-23 — End: 2021-06-23
  Administered 2021-06-23: 15 mg via INTRAVENOUS
  Filled 2021-06-23: qty 1

## 2021-06-23 MED ORDER — SODIUM CHLORIDE 0.9 % IV BOLUS
1000.0000 mL | Freq: Once | INTRAVENOUS | Status: AC
  Administered 2021-06-23: 1000 mL via INTRAVENOUS

## 2021-06-23 MED ORDER — ONDANSETRON HCL 4 MG/2ML IJ SOLN
4.0000 mg | Freq: Once | INTRAMUSCULAR | Status: AC
Start: 2021-06-23 — End: 2021-06-23
  Administered 2021-06-23: 4 mg via INTRAVENOUS
  Filled 2021-06-23: qty 2

## 2021-06-23 MED ORDER — TAMSULOSIN HCL 0.4 MG PO CAPS
0.4000 mg | ORAL_CAPSULE | Freq: Every day | ORAL | 0 refills | Status: AC
  Filled 2021-06-23: qty 7, 7d supply, fill #0

## 2021-06-23 MED ORDER — HYDROCODONE-ACETAMINOPHEN 5-325 MG PO TABS
1.0000 | ORAL_TABLET | ORAL | 0 refills | Status: DC | PRN
  Filled 2021-06-23: qty 12, 2d supply, fill #0

## 2021-06-23 MED ORDER — CEPHALEXIN 500 MG PO CAPS
500.0000 mg | ORAL_CAPSULE | Freq: Three times a day (TID) | ORAL | 0 refills | Status: AC
  Filled 2021-06-23: qty 30, 10d supply, fill #0

## 2021-06-23 MED ORDER — ONDANSETRON HCL 4 MG PO TABS
4.0000 mg | ORAL_TABLET | ORAL | 0 refills | Status: DC | PRN
  Filled 2021-06-23: qty 6, 1d supply, fill #0

## 2021-06-23 NOTE — Discharge Instructions (Addendum)
Please follow-up with your PCP for nonemergent pelvic ultrasound as you had a possible fibroid on your prior CT imaging.  It was a pleasure caring for you today in the emergency department.  Please return to the emergency department for any worsening or worrisome symptoms.

## 2021-06-23 NOTE — ED Triage Notes (Signed)
She tells me that she was dx with a 54mm right ureteral stone on Fri. At which time they did not prescribe antibiotics. She is here today with persistent c/o right flank pain with waxing/waning fever. She is ambulatory and in no distress.

## 2021-06-23 NOTE — ED Provider Notes (Signed)
[place] EMERGENCY DEPT Provider Note   CSN: [id number] Arrival date & time: 06/23/21  [id number]     History  No chief complaint on file.   Beth Morrow is a 31 y.o. female.  This is a 31 y.o. female  with significant medical history as below, including kidney stone who presents to the ED with complaint of right-sided flank pain, nausea, poor appetite.  Patient ports onset of symptoms approximately 4 to 5 days ago.  She was evaluated outside facility 4 days ago, found to have 3 mm stone at right UVJ.  No evidence of sepsis at that time.  Patient ports ongoing pain since then, nausea, reduced appetite.  No vomiting.  Fevers and chills over the past 24 hours.  Abdominal pain to the right flank, described as aching, cramping, sharp.  No radiation to her anterior portion of abdomen.  No pelvic pain.  No abnormal vaginal bleeding or discharge.  She has some mild increase to urine frequency but otherwise no dysuria, hematuria or urgency.  No recent travel or sick contacts.  No history of kidney stone prior to recent diagnosis.     Past Medical History: No date: Dental abscess No date: Medical history non-contributory No date: Pancreatic mass No date: Renal stone     The history is provided by the patient. No language interpreter was used.      Home Medications Prior to Admission medications   Medication Sig Start Date End Date Taking? Authorizing Provider  cephALEXin (KEFLEX) 500 MG capsule Take 1 capsule (500 mg total) by mouth 3 (three) times daily for 10 days. 06/23/21 07/03/21 Yes Jeanell Sparrow, DO  HYDROcodone-acetaminophen (NORCO/VICODIN) 5-325 MG tablet Take 1 tablet by mouth every 4 (four) hours as needed. 06/23/21  Yes Wynona Dove A, DO  naproxen (NAPROSYN) 375 MG tablet Take 1 tablet twice daily as needed for pain. 03/16/18   Molpus, John, MD  ondansetron (ZOFRAN) 4 MG tablet Take 1 tablet (4 mg total) by mouth every 4 (four) hours as needed for nausea or  vomiting. 06/23/21  Yes Wynona Dove A, DO  tamsulosin (FLOMAX) 0.4 MG CAPS capsule Take 1 capsule (0.4 mg total) by mouth daily for 7 days. 06/23/21 06/30/21 Yes Jeanell Sparrow, DO      Allergies    Patient has no known allergies.    Review of Systems   Review of Systems  Constitutional:  Positive for appetite change, chills and fever. Negative for fatigue.  HENT:  Negative for facial swelling and trouble swallowing.   Eyes:  Negative for photophobia and visual disturbance.  Respiratory:  Negative for cough and shortness of breath.   Cardiovascular:  Negative for chest pain and palpitations.  Gastrointestinal:  Positive for abdominal pain and nausea. Negative for vomiting.  Endocrine: Negative for polydipsia and polyuria.  Genitourinary:  Positive for flank pain and frequency. Negative for difficulty urinating and hematuria.  Musculoskeletal:  Negative for gait problem and joint swelling.  Skin:  Negative for pallor and rash.  Neurological:  Negative for syncope and headaches.  Psychiatric/Behavioral:  Negative for agitation and confusion.    Physical Exam Updated Vital Signs BP 107/83    Pulse (!) 35    Temp (!) 101.5 F (38.6 C) (Oral)    Resp 16    LMP  (LMP Unknown)    SpO2 100%  Physical Exam Vitals and nursing note reviewed.  Constitutional:      General: She is not in acute distress.  Appearance: Normal appearance.  HENT:     Head: Normocephalic and atraumatic.     Right Ear: External ear normal.     Left Ear: External ear normal.     Nose: Nose normal.     Mouth/Throat:     Mouth: Mucous membranes are moist.  Eyes:     General: No scleral icterus.       Right eye: No discharge.        Left eye: No discharge.  Cardiovascular:     Rate and Rhythm: Normal rate and regular rhythm.     Pulses: Normal pulses.     Heart sounds: Normal heart sounds.  Pulmonary:     Effort: Pulmonary effort is normal. No respiratory distress.     Breath sounds: Normal breath sounds.   Abdominal:     General: Abdomen is flat.     Palpations: Abdomen is soft.     Tenderness: There is abdominal tenderness. There is no guarding or rebound.       Comments: Mild tenderness to right flank.  Not peritoneal, not rebound.  No tenderness to anterior abdomen or left flank.  Musculoskeletal:        General: Normal range of motion.     Cervical back: Normal range of motion.     Right lower leg: No edema.     Left lower leg: No edema.  Skin:    General: Skin is warm and dry.     Capillary Refill: Capillary refill takes less than 2 seconds.  Neurological:     Mental Status: She is alert.  Psychiatric:        Mood and Affect: Mood normal.        Behavior: Behavior normal.    ED Results / Procedures / Treatments   Labs (all labs ordered are listed, but only abnormal results are displayed) Labs Reviewed  URINALYSIS, ROUTINE W REFLEX MICROSCOPIC - Abnormal; Notable for the following components:      Result Value   Hgb urine dipstick SMALL (*)    Ketones, ur 15 (*)    Protein, ur 30 (*)    Leukocytes,Ua SMALL (*)    Bacteria, UA FEW (*)    All other components within normal limits  CBC WITH DIFFERENTIAL/PLATELET - Abnormal; Notable for the following components:   Monocytes Absolute 1.3 (*)    All other components within normal limits  COMPREHENSIVE METABOLIC PANEL - Abnormal; Notable for the following components:   Sodium 133 (*)    Glucose, Bld 100 (*)    AST 12 (*)    Alkaline Phosphatase 35 (*)    All other components within normal limits  LIPASE, BLOOD - Abnormal; Notable for the following components:   Lipase 5 (*)    All other components within normal limits  RESP PANEL BY RT-PCR (FLU A&B, COVID) ARPGX2  URINE CULTURE  PREGNANCY, URINE    EKG None  Radiology No results found.  Procedures .Critical Care Performed by: Jeanell Sparrow, DO Authorized by: Jeanell Sparrow, DO   Critical care provider statement:    Critical care time (minutes):  30    Critical care time was exclusive of:  Separately billable procedures and treating other patients   Critical care was necessary to treat or prevent imminent or life-threatening deterioration of the following conditions:  Dehydration   Critical care was time spent personally by me on the following activities:  Development of treatment plan with patient or surrogate, discussions with consultants, evaluation  of patient's response to treatment, examination of patient, ordering and review of laboratory studies, ordering and review of radiographic studies, ordering and performing treatments and interventions, pulse oximetry, re-evaluation of patient's condition and review of old charts    Medications Ordered in ED Medications  sodium chloride 0.9 % bolus 1,000 mL (0 mLs Intravenous Stopped 06/23/21 0928)  acetaminophen (TYLENOL) tablet 650 mg (650 mg Oral Given 06/23/21 0800)  ketorolac (TORADOL) 15 MG/ML injection 15 mg (15 mg Intravenous Given 06/23/21 0817)  ondansetron (ZOFRAN) injection 4 mg (4 mg Intravenous Given 06/23/21 0819)  cefTRIAXone (ROCEPHIN) 1 g in sodium chloride 0.9 % 100 mL IVPB (0 g Intravenous Stopped 06/23/21 0902)  sodium chloride 0.9 % bolus 1,000 mL (1,000 mLs Intravenous New Bag/Given 06/23/21 1038)    ED Course/ Medical Decision Making/ A&P                           Medical Decision Making Amount and/or Complexity of Data Reviewed External Data Reviewed: labs, radiology and notes. Labs: ordered.  Risk OTC drugs. Prescription drug management.  Critical Care Total time providing critical care: 30-74 minutes   CC: Right-sided flank pain  This patient complains of above; this involves an extensive number of treatment options and is a complaint that carries with it a high risk of complications and morbidity. Vital signs were reviewed. Serious etiologies considered.  Record review:  Previous records obtained and reviewed   Patient with recent valuation on Saturday for  the same.  Right-sided UVJ stone. 83mm  Work up as above, notable for:   Lab results that were available during my care of the patient were reviewed by me and considered in my medical decision making.   Concern for borderline UTI on urinalysis, urine culture sent. She has fever. Start antibiotics.  Labs stable otherwise, renal function WNL.  She is HDS.  Management: Patient given IV fluids, analgesics, antibiotics.  Reassessment:  Patient feels her symptoms have greatly improved following intervention.  She is able tolerate oral intake.  Nausea has resolved.  Abdominal pain greatly improved.   Recommend urine strainer, Flomax, Zofran, analgesics, follow-up PCP.  Follow-up with urology if still symptomatic.  The patient improved significantly and was discharged in stable condition. Detailed discussions were had with the patient regarding current findings, and need for close f/u with PCP or on call doctor. The patient has been instructed to return immediately if the symptoms worsen in any way for re-evaluation. Patient verbalized understanding and is in agreement with current care plan. All questions answered prior to discharge.   Cardiac monitor reviewed by myself demonstrates normal sinus rhythm        This chart was dictated using voice recognition software.  Despite best efforts to proofread,  errors can occur which can change the documentation meaning.         Final Clinical Impression(s) / ED Diagnoses Final diagnoses:  Complicated UTI (urinary tract infection)  Mild dehydration    Rx / DC Orders ED Discharge Orders          Ordered    HYDROcodone-acetaminophen (NORCO/VICODIN) 5-325 MG tablet  Every 4 hours PRN        06/23/21 1113    tamsulosin (FLOMAX) 0.4 MG CAPS capsule  Daily        06/23/21 1113    ondansetron (ZOFRAN) 4 MG tablet  Every 4 hours PRN        06/23/21 1113  cephALEXin (KEFLEX) 500 MG capsule  3 times daily        06/23/21 1113               Jeanell Sparrow, DO 06/23/21 1115

## 2021-06-24 LAB — LIPASE, BLOOD: Lipase: <10 U/L — ABNORMAL LOW (ref 11–51)

## 2021-06-25 LAB — URINE CULTURE: Culture: 10000 — AB

## 2021-06-26 ENCOUNTER — Telehealth (HOSPITAL_BASED_OUTPATIENT_CLINIC_OR_DEPARTMENT_OTHER): Payer: Self-pay | Admitting: *Deleted

## 2021-06-26 NOTE — Telephone Encounter (Signed)
Post ED Visit - Positive Culture Follow-up  Culture report reviewed by antimicrobial stewardship pharmacist: [facility] []  , Pharm.D. []  Enzo Bi, Pharm.D., BCPS AQ-ID []  , Pharm.D., BCPS []  Celedonio Miyamoto, .D., BCPS []  [place], .D., BCPS, AAHIVP []  Georgina Pillion, Pharm.D., BCPS, AAHIVP []  [address], PharmD, BCPS []  , PharmD, BCPS []  [place], PharmD, BCPS []  [address], PharmD []  , PharmD, BCPS [x]  Estella Husk, PharmD  Pharmacy Team []  Lysle Pearl, PharmD []  , PharmD []  Phillips Climes, PharmD []  , Rph []  Agapito Games) , PharmD []  Verlan Friends, PharmD []  , PharmD []  Mervyn Gay, PharmD []  , PharmD []  Narda Bonds, PharmD []  [facility], PharmD []  , PharmD []  Len Childs, PharmD   Positive urine culture Treated with Cephalexin, organism sensitive to the same and no further patient follow-up is required at this time.  06/26/2021, 11:19 AM

## 2022-01-24 ENCOUNTER — Encounter (HOSPITAL_BASED_OUTPATIENT_CLINIC_OR_DEPARTMENT_OTHER): Payer: Self-pay

## 2022-01-24 ENCOUNTER — Emergency Department (HOSPITAL_BASED_OUTPATIENT_CLINIC_OR_DEPARTMENT_OTHER): Payer: No Typology Code available for payment source

## 2022-01-24 ENCOUNTER — Other Ambulatory Visit: Payer: Self-pay

## 2022-01-24 DIAGNOSIS — N3000 Acute cystitis without hematuria: Secondary | ICD-10-CM | POA: Insufficient documentation

## 2022-01-24 DIAGNOSIS — M545 Low back pain, unspecified: Secondary | ICD-10-CM | POA: Diagnosis not present

## 2022-01-24 DIAGNOSIS — R109 Unspecified abdominal pain: Secondary | ICD-10-CM | POA: Diagnosis present

## 2022-01-24 LAB — URINALYSIS, ROUTINE W REFLEX MICROSCOPIC
Bilirubin Urine: NEGATIVE
Glucose, UA: NEGATIVE mg/dL
Hgb urine dipstick: NEGATIVE
Ketones, ur: NEGATIVE mg/dL
Nitrite: NEGATIVE
Specific Gravity, Urine: 1.019 (ref 1.005–1.030)
WBC, UA: >50 WBC/hpf — ABNORMAL HIGH (ref 0–5)
pH: 6 (ref 5.0–8.0)

## 2022-01-24 LAB — COMPREHENSIVE METABOLIC PANEL
ALT: 10 U/L (ref 0–44)
AST: 12 U/L — ABNORMAL LOW (ref 15–41)
Albumin: 4.1 g/dL (ref 3.5–5.0)
Alkaline Phosphatase: 33 U/L — ABNORMAL LOW (ref 38–126)
Anion gap: 9 (ref 5–15)
BUN: 12 mg/dL (ref 6–20)
CO2: 22 mmol/L (ref 22–32)
Calcium: 9.2 mg/dL (ref 8.9–10.3)
Chloride: 106 mmol/L (ref 98–111)
Creatinine, Ser: 0.88 mg/dL (ref 0.44–1.00)
GFR, Estimated: >60 mL/min (ref >60)
Glucose, Bld: 92 mg/dL (ref 70–99)
Potassium: 3.6 mmol/L (ref 3.5–5.1)
Sodium: 137 mmol/L (ref 135–145)
Total Bilirubin: 0.4 mg/dL (ref 0.3–1.2)
Total Protein: 7.5 g/dL (ref 6.5–8.1)

## 2022-01-24 LAB — CBC
HCT: 37.7 % (ref 36.0–46.0)
Hemoglobin: 13.1 g/dL (ref 12.0–15.0)
MCH: 30.8 pg (ref 26.0–34.0)
MCHC: 34.7 g/dL (ref 30.0–36.0)
MCV: 88.7 fL (ref 80.0–100.0)
Platelets: 325 10*3/uL (ref 150–400)
RBC: 4.25 MIL/uL (ref 3.87–5.11)
RDW: 12.9 % (ref 11.5–15.5)
WBC: 6.7 10*3/uL (ref 4.0–10.5)
nRBC: 0 % (ref 0.0–0.2)

## 2022-01-24 LAB — LIPASE, BLOOD: Lipase: <10 U/L — ABNORMAL LOW (ref 11–51)

## 2022-01-24 LAB — PREGNANCY, URINE: Preg Test, Ur: NEGATIVE

## 2022-01-24 NOTE — ED Triage Notes (Signed)
Pt presents to the ED with concern for a kidney stone. Last had a kidney stone in January and this feels similar. States that she is having bilateral lower back pain and lower abdominal pain. Reports dysuria, frequency and urgency since last Tuesday.

## 2022-01-25 ENCOUNTER — Emergency Department (HOSPITAL_BASED_OUTPATIENT_CLINIC_OR_DEPARTMENT_OTHER)
Admission: EM | Admit: 2022-01-25 | Discharge: 2022-01-25 | Disposition: A | Discharge status: Home / Self Care | Payer: No Typology Code available for payment source | Attending: Emergency Medicine | Admitting: Emergency Medicine

## 2022-01-25 DIAGNOSIS — N3000 Acute cystitis without hematuria: Secondary | ICD-10-CM

## 2022-01-25 MED ORDER — CEPHALEXIN 500 MG PO CAPS: 500.0000 mg | ORAL_CAPSULE | Freq: Two times a day (BID) | ORAL | 0 refills | Status: AC

## 2022-01-25 MED ORDER — CEPHALEXIN 250 MG PO CAPS
500.0000 mg | ORAL_CAPSULE | Freq: Once | ORAL | Status: AC
  Administered 2022-01-25: 500 mg via ORAL
  Filled 2022-01-25: qty 2

## 2022-01-25 NOTE — ED Notes (Signed)
Pt has dysuria and frequency, vss

## 2022-01-25 NOTE — ED Notes (Signed)
Pt declined tylenol for pain.

## 2022-01-25 NOTE — ED Provider Notes (Signed)
[facility] EMERGENCY DEPT  Provider Note  CSN: [id number] Arrival date & time: 01/24/22 2047  History Chief Complaint  Patient presents with   Abdominal Pain    Beth Morrow is a 31 y.o. female reports she has had dysuria and frequency for the last week, not improving with home remedies such as cranberry juice and probiotics. She began having some low back pain yesterday which reminded her of when she had a kidney stone earlier this year. No fever, vomiting or flank pain.    Home Medications Prior to Admission medications   Medication Sig Start Date End Date Taking? Authorizing Provider  cephALEXin (KEFLEX) 500 MG capsule Take 1 capsule (500 mg total) by mouth 2 (two) times daily for 7 days. 01/25/22 02/01/22 Yes Pollyann Savoy, MD  HYDROcodone-acetaminophen (NORCO/VICODIN) 5-325 MG tablet Take 1 tablet by mouth every 4 (four) hours as needed. 06/23/21   Sloan Leiter, DO  naproxen (NAPROSYN) 375 MG tablet Take 1 tablet twice daily as needed for pain. 03/16/18   Molpus, John, MD  ondansetron (ZOFRAN) 4 MG tablet Take 1 tablet (4 mg total) by mouth every 4 (four) hours as needed for nausea or vomiting. 06/23/21   Sloan Leiter, DO     Allergies    Patient has no known allergies.   Review of Systems   Review of Systems Please see HPI for pertinent positives and negatives  Physical Exam BP (!) 133/96   Pulse 65   Temp 98.3 F (36.8 C) (Oral)   Resp 18   Ht 5\' 3"  (1.6 m)   Wt 72.6 kg   LMP 12/29/2021 (Approximate)   SpO2 100%   BMI 28.34 kg/m   Physical Exam Vitals and nursing note reviewed.  Constitutional:      Appearance: Normal appearance.  HENT:     Head: Normocephalic and atraumatic.     Nose: Nose normal.     Mouth/Throat:     Mouth: Mucous membranes are moist.  Eyes:     Extraocular Movements: Extraocular movements intact.     Conjunctiva/sclera: Conjunctivae normal.  Cardiovascular:     Rate and Rhythm: Normal rate.  Pulmonary:      Effort: Pulmonary effort is normal.     Breath sounds: Normal breath sounds.  Abdominal:     General: Abdomen is flat.     Palpations: Abdomen is soft.     Tenderness: There is no abdominal tenderness. There is no right CVA tenderness, left CVA tenderness or guarding. Negative signs include Murphy's sign and McBurney's sign.  Musculoskeletal:        General: No swelling. Normal range of motion.     Cervical back: Neck supple.  Skin:    General: Skin is warm and dry.  Neurological:     General: No focal deficit present.     Mental Status: She is alert.  Psychiatric:        Mood and Affect: Mood normal.     ED Results / Procedures / Treatments   EKG None  Procedures Procedures  Medications Ordered in the ED Medications  cephALEXin (KEFLEX) capsule 500 mg (has no administration in time range)    Initial Impression and Plan  Patient here with dysuria and low back pain. Had labs and CT done in triage. CBC CMP, lipase and HCG were normal, UA consistent with UTI. CT was neg for renal stones. Patient aware of incidental chronic findings. Plan oral Abx, PCP follow up.   ED Course  MDM Rules/Calculators/A&P Medical Decision Making Problems Addressed: Acute cystitis without hematuria: acute illness or injury  Amount and/or Complexity of Data Reviewed Labs: ordered. Decision-making details documented in ED Course. Radiology: ordered and independent interpretation performed. Decision-making details documented in ED Course.  Risk Prescription drug management.    Final Clinical Impression(s) / ED Diagnoses Final diagnoses:  Acute cystitis without hematuria    Rx / DC Orders ED Discharge Orders          Ordered    cephALEXin (KEFLEX) 500 MG capsule  2 times daily        01/25/22 0145             Pollyann Savoy, MD 01/25/22 [phone]

## 2022-02-10 DIAGNOSIS — Z3009 Encounter for other general counseling and advice on contraception: Secondary | ICD-10-CM | POA: Diagnosis not present

## 2022-02-10 DIAGNOSIS — Z1388 Encounter for screening for disorder due to exposure to contaminants: Secondary | ICD-10-CM | POA: Diagnosis not present

## 2022-02-10 DIAGNOSIS — Z0389 Encounter for observation for other suspected diseases and conditions ruled out: Secondary | ICD-10-CM | POA: Diagnosis not present

## 2023-09-08 ENCOUNTER — Encounter (HOSPITAL_BASED_OUTPATIENT_CLINIC_OR_DEPARTMENT_OTHER): Payer: Self-pay

## 2023-09-08 ENCOUNTER — Emergency Department (HOSPITAL_BASED_OUTPATIENT_CLINIC_OR_DEPARTMENT_OTHER): Admitting: Radiology

## 2023-09-08 ENCOUNTER — Emergency Department (HOSPITAL_BASED_OUTPATIENT_CLINIC_OR_DEPARTMENT_OTHER)

## 2023-09-08 ENCOUNTER — Other Ambulatory Visit: Payer: Self-pay

## 2023-09-08 ENCOUNTER — Observation Stay (HOSPITAL_BASED_OUTPATIENT_CLINIC_OR_DEPARTMENT_OTHER)
Admission: EM | Admit: 2023-09-08 | Discharge: 2023-09-09 | Disposition: A | Discharge status: Home / Self Care | Attending: Internal Medicine | Admitting: Internal Medicine

## 2023-09-08 DIAGNOSIS — Z87891 Personal history of nicotine dependence: Secondary | ICD-10-CM | POA: Diagnosis not present

## 2023-09-08 DIAGNOSIS — K8080 Other cholelithiasis without obstruction: Secondary | ICD-10-CM | POA: Insufficient documentation

## 2023-09-08 DIAGNOSIS — R748 Abnormal levels of other serum enzymes: Principal | ICD-10-CM | POA: Insufficient documentation

## 2023-09-08 DIAGNOSIS — R079 Chest pain, unspecified: Secondary | ICD-10-CM | POA: Diagnosis present

## 2023-09-08 DIAGNOSIS — I272 Pulmonary hypertension, unspecified: Secondary | ICD-10-CM | POA: Insufficient documentation

## 2023-09-08 DIAGNOSIS — R12 Heartburn: Secondary | ICD-10-CM | POA: Diagnosis not present

## 2023-09-08 DIAGNOSIS — Z79899 Other long term (current) drug therapy: Secondary | ICD-10-CM | POA: Insufficient documentation

## 2023-09-08 DIAGNOSIS — K859 Acute pancreatitis without necrosis or infection, unspecified: Secondary | ICD-10-CM | POA: Diagnosis not present

## 2023-09-08 DIAGNOSIS — K219 Gastro-esophageal reflux disease without esophagitis: Secondary | ICD-10-CM | POA: Insufficient documentation

## 2023-09-08 LAB — HEPATIC FUNCTION PANEL
ALT: 22 U/L (ref 0–44)
AST: 20 U/L (ref 15–41)
Albumin: 3.9 g/dL (ref 3.5–5.0)
Alkaline Phosphatase: 31 U/L — ABNORMAL LOW (ref 38–126)
Bilirubin, Direct: 0.1 mg/dL (ref 0.0–0.2)
Indirect Bilirubin: 0.3 mg/dL (ref 0.3–0.9)
Total Bilirubin: 0.4 mg/dL (ref 0.0–1.2)
Total Protein: 6.8 g/dL (ref 6.5–8.1)

## 2023-09-08 LAB — CBC
HCT: 36.2 % (ref 36.0–46.0)
Hemoglobin: 12.6 g/dL (ref 12.0–15.0)
MCH: 31 pg (ref 26.0–34.0)
MCHC: 34.8 g/dL (ref 30.0–36.0)
MCV: 89.2 fL (ref 80.0–100.0)
Platelets: 355 10*3/uL (ref 150–400)
RBC: 4.06 MIL/uL (ref 3.87–5.11)
RDW: 12.9 % (ref 11.5–15.5)
WBC: 6.8 10*3/uL (ref 4.0–10.5)
nRBC: 0 % (ref 0.0–0.2)

## 2023-09-08 LAB — BASIC METABOLIC PANEL WITH GFR
Anion gap: 8 (ref 5–15)
BUN: 9 mg/dL (ref 6–20)
CO2: 23 mmol/L (ref 22–32)
Calcium: 8.9 mg/dL (ref 8.9–10.3)
Chloride: 105 mmol/L (ref 98–111)
Creatinine, Ser: 0.85 mg/dL (ref 0.44–1.00)
GFR, Estimated: >60 mL/min (ref >60)
Glucose, Bld: 92 mg/dL (ref 70–99)
Potassium: 3.6 mmol/L (ref 3.5–5.1)
Sodium: 136 mmol/L (ref 135–145)

## 2023-09-08 LAB — LIPASE, BLOOD: Lipase: 1560 U/L — ABNORMAL HIGH (ref 11–51)

## 2023-09-08 LAB — COMPREHENSIVE METABOLIC PANEL WITH GFR
ALT: 23 U/L (ref 0–44)
AST: 21 U/L (ref 15–41)
Albumin: 3.1 g/dL — ABNORMAL LOW (ref 3.5–5.0)
Alkaline Phosphatase: 35 U/L — ABNORMAL LOW (ref 38–126)
Anion gap: 7 (ref 5–15)
BUN: 7 mg/dL (ref 6–20)
CO2: 24 mmol/L (ref 22–32)
Calcium: 8.9 mg/dL (ref 8.9–10.3)
Chloride: 108 mmol/L (ref 98–111)
Creatinine, Ser: 0.82 mg/dL (ref 0.44–1.00)
GFR, Estimated: >60 mL/min (ref >60)
Glucose, Bld: 99 mg/dL (ref 70–99)
Potassium: 3.8 mmol/L (ref 3.5–5.1)
Sodium: 139 mmol/L (ref 135–145)
Total Bilirubin: 0.5 mg/dL (ref 0.0–1.2)
Total Protein: 6.4 g/dL — ABNORMAL LOW (ref 6.5–8.1)

## 2023-09-08 LAB — D-DIMER, QUANTITATIVE: D-Dimer, Quant: 0.64 ug{FEU}/mL — ABNORMAL HIGH (ref 0.00–0.50)

## 2023-09-08 LAB — TROPONIN I (HIGH SENSITIVITY)
Troponin I (High Sensitivity): <2 ng/L (ref <18)
Troponin I (High Sensitivity): <2 ng/L (ref <18)

## 2023-09-08 LAB — TRIGLYCERIDES: Triglycerides: 131 mg/dL (ref <150)

## 2023-09-08 LAB — HIV ANTIBODY (ROUTINE TESTING W REFLEX): HIV Screen 4th Generation wRfx: NONREACTIVE

## 2023-09-08 LAB — PREGNANCY, URINE: Preg Test, Ur: NEGATIVE

## 2023-09-08 LAB — ETHANOL: Alcohol, Ethyl (B): <10 mg/dL (ref <10)

## 2023-09-08 LAB — MAGNESIUM: Magnesium: 1.9 mg/dL (ref 1.7–2.4)

## 2023-09-08 MED ORDER — ONDANSETRON HCL 4 MG/2ML IJ SOLN: 4.0000 mg | Freq: Four times a day (QID) | INTRAMUSCULAR | Status: DC | PRN

## 2023-09-08 MED ORDER — IOHEXOL 350 MG/ML SOLN
100.0000 mL | Freq: Once | INTRAVENOUS | Status: AC | PRN
  Administered 2023-09-08: 100 mL via INTRAVENOUS

## 2023-09-08 MED ORDER — ONDANSETRON HCL 4 MG PO TABS: 4.0000 mg | ORAL_TABLET | Freq: Four times a day (QID) | ORAL | Status: DC | PRN

## 2023-09-08 MED ORDER — OXYCODONE HCL 5 MG PO TABS
5.0000 mg | ORAL_TABLET | ORAL | Status: DC | PRN
  Administered 2023-09-09: 5 mg via ORAL
  Filled 2023-09-08: qty 1

## 2023-09-08 MED ORDER — LACTATED RINGERS IV SOLN: INTRAVENOUS | Status: AC

## 2023-09-08 MED ORDER — LACTATED RINGERS IV BOLUS
1000.0000 mL | Freq: Once | INTRAVENOUS | Status: AC
  Administered 2023-09-08: 1000 mL via INTRAVENOUS

## 2023-09-08 MED ORDER — MORPHINE SULFATE (PF) 2 MG/ML IV SOLN: 2.0000 mg | INTRAVENOUS | Status: DC | PRN

## 2023-09-08 MED ORDER — ONDANSETRON HCL 4 MG/2ML IJ SOLN
4.0000 mg | Freq: Once | INTRAMUSCULAR | Status: AC
  Administered 2023-09-08: 4 mg via INTRAVENOUS
  Filled 2023-09-08: qty 2

## 2023-09-08 MED ORDER — LIDOCAINE VISCOUS HCL 2 % MT SOLN
15.0000 mL | Freq: Once | OROMUCOSAL | Status: AC
  Administered 2023-09-08: 15 mL via OROMUCOSAL
  Filled 2023-09-08: qty 15

## 2023-09-08 MED ORDER — FENTANYL CITRATE PF 50 MCG/ML IJ SOSY
50.0000 ug | PREFILLED_SYRINGE | Freq: Once | INTRAMUSCULAR | Status: AC
  Administered 2023-09-08: 50 ug via INTRAVENOUS
  Filled 2023-09-08: qty 1

## 2023-09-08 MED ORDER — ACETAMINOPHEN 325 MG PO TABS
650.0000 mg | ORAL_TABLET | Freq: Four times a day (QID) | ORAL | Status: DC | PRN
  Administered 2023-09-09: 650 mg via ORAL
  Filled 2023-09-08: qty 2

## 2023-09-08 MED ORDER — ALUM & MAG HYDROXIDE-SIMETH 200-200-20 MG/5ML PO SUSP
30.0000 mL | Freq: Four times a day (QID) | ORAL | Status: DC | PRN
  Administered 2023-09-08: 30 mL via ORAL
  Filled 2023-09-08: qty 30

## 2023-09-08 MED ORDER — AMOXICILLIN-POT CLAVULANATE 875-125 MG PO TABS
1.0000 | ORAL_TABLET | Freq: Two times a day (BID) | ORAL | Status: DC
  Administered 2023-09-08 – 2023-09-09 (×3): 1 via ORAL
  Filled 2023-09-08 (×3): qty 1

## 2023-09-08 MED ORDER — ALUM & MAG HYDROXIDE-SIMETH 200-200-20 MG/5ML PO SUSP
30.0000 mL | Freq: Once | ORAL | Status: AC
  Administered 2023-09-08: 30 mL via ORAL
  Filled 2023-09-08: qty 30

## 2023-09-08 MED ORDER — PANTOPRAZOLE SODIUM 40 MG IV SOLR
40.0000 mg | Freq: Two times a day (BID) | INTRAVENOUS | Status: DC
  Administered 2023-09-08 – 2023-09-09 (×3): 40 mg via INTRAVENOUS
  Filled 2023-09-08 (×3): qty 10

## 2023-09-08 MED ORDER — ACETAMINOPHEN 650 MG RE SUPP: 650.0000 mg | Freq: Four times a day (QID) | RECTAL | Status: DC | PRN

## 2023-09-08 NOTE — ED Notes (Signed)
 Report given to [facility].

## 2023-09-08 NOTE — H&P (Signed)
 History and Physical    Beth Morrow ZOX:[id number] DOB: [birth year] DOA: 09/08/2023  PCP: [facility] [place]   Patient coming from: Home Chief Complaint  Patient presents with   Chest Pain     HPI: 33 female with past medical history of renal stone, GERD, current dental infection taking amoxicillin/Tylenol/ibuprofen presented to the ED with central chest pain of sudden onset that woke her up at 230 also got dizzy and diaphoretic.  EMS was called and brought to the ED.  Of note patient had "heartburn" around 10:20 PM before she went to the bed. In the ED chest pain is nonreproducible, given GI cocktail due to concern for esophagitis/GERD routine labs were obtained troponin negative D-dimer 0.6 slightly elevated lipase significantly high 1500 with normal LFTs.  Troponin negative x 2 pregnancy test negative hemodynamically stable.  Patient was diagnosed with acute pancreatitis and transferred to [facility] for further admission.  Patient received fluid, pain medication. On exam this morning 09/08/23 she is alert awake oriented, remains hemodynamically stable.Her nausea has subsided. Patient otherwise denies any vomiting, chest pain, shortness of breath, fever, chills, headache, focal weakness, numbness tingling, speech difficulties   Procedures/testing: CTA chest/CT abdomen > no PE.. Enlargement of the pulmonary outflow tract/main pulmonary arteries suggests pulmonary arterial hypertension.  No acute finding in the abdomen or pelvis no finding to suggest acute pancreatitis. 4.0 x 3.7 cm fluid density structure in the right upper quadrant just below the pylorus and medial to the gallbladder was 4.4 x 3.3 cm on a study from 06/19/2021 and was present but incompletely visualized on a chest CTA of 09/25/2017 where it was measured at 5-6 cm. Given long-term persistence, features are most suggestive of benign etiology.  Ultrasound abdomen RUQ> cholelithiasis without acute  cholecystitis.  Assessment and plan:  Acute pancreatitis Heartburn/chest pain Gerd: Initially presented with heartburn/chest pain workup showed acute pancreatitis based upon labs.No PE,troponin negative x 2, CTA chest abdomen pelvis no acute finding-besides benign fluid density in the right upper quadrant present before. She had relief from the gi cocktail it seems. Will manage symptomatically with IV fluid hydration clear liquid diet advance as tolerated, pain control with IV and oral opiates, antiemetics, PPI.  Ethanol undetectable, TG pending. trend labs.   Asymptomatic cholelithiasis: She is advised to fu PCP/ Surgery.  Recent dental infection: Patient was taking amoxicillin NSAIDs and Tylenol PTA  Severity of Illness: The appropriate patient status for this patient is INPATIENT. Inpatient status is judged to be reasonable and necessary in order to provide the required intensity of service to ensure the patient's safety. The patient's presenting symptoms, physical exam findings, and initial radiographic and laboratory data in the context of their chronic comorbidities is felt to place them at high risk for further clinical deterioration. Furthermore, it is not anticipated that the patient will be medically stable for discharge from the hospital within 2 midnights of admission.   * I certify that at the point of admission it is my clinical judgment that the patient will require inpatient hospital care spanning beyond 2 midnights from the point of admission due to high intensity of service, high risk for further deterioration and high frequency of surveillance required.*   DVT prophylaxis: SCDs Start: 09/08/23 1147  Code Status:   Code Status: Full Code  Family Communication: Admission, patients condition and plan of care including tests being ordered have been discussed with the patient and her father who indicate understanding and agree with the plan and Code Status.  Consults called:   none  Review of Systems: All systems were reviewed and were negative except as mentioned in HPI above. Negative for fever Negative for chest pain Negative for shortness of breath  Past Medical History:  Diagnosis Date   Dental abscess    Medical history non-contributory    Pancreatic mass    Renal stone     Past Surgical History:  Procedure Laterality Date   NO PAST SURGERIES       reports that she has quit smoking. Her smoking use included cigarettes. She has never used smokeless tobacco. She reports that she does not drink alcohol and does not use drugs.  No Known Allergies  Family History  Adopted: Yes    Prior to Admission medications   Medication Sig Start Date End Date Taking? Authorizing Provider  HYDROcodone-acetaminophen (NORCO/VICODIN) 5-325 MG tablet Take 1 tablet by mouth every 4 (four) hours as needed. 06/23/21   Sloan Leiter, DO  naproxen (NAPROSYN) 375 MG tablet Take 1 tablet twice daily as needed for pain. 03/16/18   Molpus, John, MD  ondansetron (ZOFRAN) 4 MG tablet Take 1 tablet (4 mg total) by mouth every 4 (four) hours as needed for nausea or vomiting. 06/23/21   Sloan Leiter, DO   Physical Exam: Vitals:   09/08/23 0830 09/08/23 1015 09/08/23 1051 09/08/23 1105  BP: 107/80 117/79 (!) 130/93 (!) 130/93  Pulse: 62 61 63 63  Resp: 11 (!) 22 17 17   Temp:   98.1 F (36.7 C) 98.1 F (36.7 C)  TempSrc:   Oral Oral  SpO2: 100% 100% 100%   Weight:    69.4 kg  Height:    5\' 3"  (1.6 m)   General exam: AAOx3,NAD,weak appearing. HEENT:Oral mucosa moist, Ear/Nose WNL grossly, dentition normal. Respiratory system: bilaterally clear ,no wheezing or crackles,no use of accessory muscle Cardiovascular system: S1 & S2 +, No JVD,. Gastrointestinal system: Abdomen soft,,ND, BS+ Nervous System:Alert, awake, moving extremities and grossly nonfocal Extremities: No edema, distal peripheral pulses palpable.  Skin: No rashes,no icterus. MSK: Normal muscle  bulk,tone, power   Labs on Admission: I have personally reviewed following labs and imaging studies CBC: Recent Labs  Lab 09/08/23 0441  WBC 6.8  HGB 12.6  HCT 36.2  MCV 89.2  PLT 355   Basic Metabolic Panel: Recent Labs  Lab 09/08/23 0441  NA 136  K 3.6  CL 105  CO2 23  GLUCOSE 92  BUN 9  CREATININE 0.85  CALCIUM 8.9   Estimated Creatinine Clearance: 87.2 mL/min (by C-G formula based on SCr of 0.85 mg/dL). Recent Labs  Lab 09/08/23 0441  AST 20  ALT 22  ALKPHOS 31*  BILITOT 0.4  PROT 6.8  ALBUMIN 3.9   Recent Labs  Lab 09/08/23 0441  LIPASE 1,560*   Recent Labs    09/08/23 0441 09/08/23 0620  TROPONINIHS <2 <2  Lipid Profile: Recent Labs    09/08/23 0724  TRIG 131   Thyroid Function Tests: No results for input(s): "TSH", "T4TOTAL", "FREET4", "T3FREE", "THYROIDAB" in the last 72 hours. Urine analysis:    Component Value Date/Time   COLORURINE YELLOW 01/24/2022 2133   APPEARANCEUR CLEAR 01/24/2022 2133   LABSPEC 1.019 01/24/2022 2133   PHURINE 6.0 01/24/2022 2133   GLUCOSEU NEGATIVE 01/24/2022 2133   HGBUR NEGATIVE 01/24/2022 2133   BILIRUBINUR NEGATIVE 01/24/2022 2133   KETONESUR NEGATIVE 01/24/2022 2133   PROTEINUR TRACE (A) 01/24/2022 2133   NITRITE NEGATIVE 01/24/2022 2133   LEUKOCYTESUR LARGE (  A) 01/24/2022 2133    Radiological Exams on Admission: US Abdomen Limited RUQ (LIVER/GB) Result Date: 09/08/2023 CLINICAL DATA:  Right upper quadrant pain. EXAM: ULTRASOUND ABDOMEN LIMITED RIGHT UPPER QUADRANT COMPARISON:  CT scan earlier same day FINDINGS: Gallbladder: Multiple gallstones evident measuring up to 7-8 mm maximum size. Although these stones demonstrate shadowing on ultrasound, review of recent CT shows that they are uncalcified. No substantial gallbladder wall thickening or pericholecystic fluid. The sonographer reports no sonographic Murphy sign. Common bile duct: Diameter: 3 mm Liver: No focal lesion identified. Within normal limits in  parenchymal echogenicity. Portal vein is patent on color Doppler imaging with normal direction of blood flow towards the liver. Other: 3.7 x 2.9 cm cystic structure in the right upper quadrant of the abdomen was better characterized on CT performed prior to this study. IMPRESSION: Cholelithiasis without sonographic evidence for acute cholecystitis. Electronically Signed   By: Kennith Center M.D.   On: 09/08/2023 08:10   CT ABDOMEN PELVIS W CONTRAST Result Date: 09/08/2023 CLINICAL DATA:  Positive D-dimer. Shortness of breath and chest pain. Elevated lipase. Clinical concern for pancreatitis. EXAM: CT ANGIOGRAPHY CHEST CT ABDOMEN AND PELVIS WITH CONTRAST TECHNIQUE: Multidetector CT imaging of the chest was performed using the standard protocol during bolus administration of intravenous contrast. Multiplanar CT image reconstructions and MIPs were obtained to evaluate the vascular anatomy. Multidetector CT imaging of the abdomen and pelvis was performed using the standard protocol during bolus administration of intravenous contrast. RADIATION DOSE REDUCTION: This exam was performed according to the departmental dose-optimization program which includes automated exposure control, adjustment of the mA and/or kV according to patient size and/or use of iterative reconstruction technique. CONTRAST:  OMNIPAQUE IOHEXOL 350 MG/ML SOLN COMPARISON:  CT stone study 01/24/2022.  Chest CTA 09/25/2017 FINDINGS: CTA CHEST FINDINGS Cardiovascular: The heart size is upper normal to borderline enlarged. No substantial pericardial effusion. No thoracic aortic aneurysm. No substantial atherosclerosis of the thoracic aorta. Enlargement of the pulmonary outflow tract/main pulmonary arteries suggests pulmonary arterial hypertension. There is no filling defect within the opacified pulmonary arteries to suggest the presence of an acute pulmonary embolus. Mediastinum/Nodes: No mediastinal lymphadenopathy. There is no hilar  lymphadenopathy. The esophagus has normal imaging features. There is no axillary lymphadenopathy. Lungs/Pleura: No pulmonary edema or pleural effusion. No focal airspace consolidation. No suspicious pulmonary nodule or mass. Musculoskeletal: No worrisome lytic or sclerotic osseous abnormality. Review of the MIP images confirms the above findings. CT ABDOMEN and PELVIS FINDINGS Hepatobiliary: No suspicious focal abnormality within the liver parenchyma. There is no evidence for gallstones, gallbladder wall thickening, or pericholecystic fluid. No intrahepatic or extrahepatic biliary dilation. Pancreas: No focal mass lesion. No dilatation of the main duct. No intraparenchymal cyst. No peripancreatic edema. Spleen: No splenomegaly. No suspicious focal mass lesion. Adrenals/Urinary Tract: No adrenal nodule or mass. Right kidney unremarkable. Tiny well-defined homogeneous low-density lesion in the left kidney is too small to characterize but is statistically most likely benign and probably a cyst. No followup imaging is recommended. No evidence for hydroureter. The urinary bladder appears normal for the degree of distention. Stomach/Bowel: Stomach is unremarkable. No gastric wall thickening. No evidence of outlet obstruction. Duodenum is normally positioned as is the ligament of Treitz. No small bowel wall thickening. No small bowel dilatation. The terminal ileum is normal. The appendix is normal. No gross colonic mass. No colonic wall thickening. Moderate stool volume throughout. Vascular/Lymphatic: No abdominal aortic aneurysm. No abdominal aortic atherosclerotic calcification. There is no gastrohepatic or hepatoduodenal ligament  lymphadenopathy. No retroperitoneal or mesenteric lymphadenopathy. No pelvic sidewall lymphadenopathy. Reproductive: Heterogeneous myometrial enhancement posterior fundal uterus may be related to fibroids. There is no adnexal mass. Other: Trace free fluid is identified in the cul-de-sac. 4.0 x  3.7 cm fluid density structure in the right upper quadrant just below the pylorus in medial to the gallbladder was 4.4 x 3.3 cm on a study from 06/19/2021 and was present but incompletely visualized on a chest CTA of 09/25/2017 where it was measured at 5-6 cm. Lesion has no perceptible wall or evidence for enhancing components. Given long-term persistence, features are most suggestive of benign etiology with lymphatics cyst/lymphangioma distinct consideration. Musculoskeletal: No worrisome lytic or sclerotic osseous abnormality. Review of the MIP images confirms the above findings. IMPRESSION: 1. No CT evidence for acute pulmonary embolus. 2. Enlargement of the pulmonary outflow tract/main pulmonary arteries suggests pulmonary arterial hypertension. 3. No acute findings in the abdomen or pelvis. Specifically, no findings to suggest acute pancreatitis. 4. 4.0 x 3.7 cm fluid density structure in the right upper quadrant just below the pylorus and medial to the gallbladder was 4.4 x 3.3 cm on a study from 06/19/2021 and was present but incompletely visualized on a chest CTA of 09/25/2017 where it was measured at 5-6 cm. Given long-term persistence, features are most suggestive of benign etiology with lymphatics cyst/lymphangioma distinct consideration. 5. Trace free fluid in the cul-de-sac, likely physiologic. Electronically Signed   By: Kennith Center M.D.   On: 09/08/2023 06:43   CT Angio Chest PE W and/or Wo Contrast Result Date: 09/08/2023 CLINICAL DATA:  Positive D-dimer. Shortness of breath and chest pain. Elevated lipase. Clinical concern for pancreatitis. EXAM: CT ANGIOGRAPHY CHEST CT ABDOMEN AND PELVIS WITH CONTRAST TECHNIQUE: Multidetector CT imaging of the chest was performed using the standard protocol during bolus administration of intravenous contrast. Multiplanar CT image reconstructions and MIPs were obtained to evaluate the vascular anatomy. Multidetector CT imaging of the abdomen and pelvis was  performed using the standard protocol during bolus administration of intravenous contrast. RADIATION DOSE REDUCTION: This exam was performed according to the departmental dose-optimization program which includes automated exposure control, adjustment of the mA and/or kV according to patient size and/or use of iterative reconstruction technique. CONTRAST:  OMNIPAQUE IOHEXOL 350 MG/ML SOLN COMPARISON:  CT stone study 01/24/2022.  Chest CTA 09/25/2017 FINDINGS: CTA CHEST FINDINGS Cardiovascular: The heart size is upper normal to borderline enlarged. No substantial pericardial effusion. No thoracic aortic aneurysm. No substantial atherosclerosis of the thoracic aorta. Enlargement of the pulmonary outflow tract/main pulmonary arteries suggests pulmonary arterial hypertension. There is no filling defect within the opacified pulmonary arteries to suggest the presence of an acute pulmonary embolus. Mediastinum/Nodes: No mediastinal lymphadenopathy. There is no hilar lymphadenopathy. The esophagus has normal imaging features. There is no axillary lymphadenopathy. Lungs/Pleura: No pulmonary edema or pleural effusion. No focal airspace consolidation. No suspicious pulmonary nodule or mass. Musculoskeletal: No worrisome lytic or sclerotic osseous abnormality. Review of the MIP images confirms the above findings. CT ABDOMEN and PELVIS FINDINGS Hepatobiliary: No suspicious focal abnormality within the liver parenchyma. There is no evidence for gallstones, gallbladder wall thickening, or pericholecystic fluid. No intrahepatic or extrahepatic biliary dilation. Pancreas: No focal mass lesion. No dilatation of the main duct. No intraparenchymal cyst. No peripancreatic edema. Spleen: No splenomegaly. No suspicious focal mass lesion. Adrenals/Urinary Tract: No adrenal nodule or mass. Right kidney unremarkable. Tiny well-defined homogeneous low-density lesion in the left kidney is too small to characterize but is statistically  most likely benign and probably a cyst. No followup imaging is recommended. No evidence for hydroureter. The urinary bladder appears normal for the degree of distention. Stomach/Bowel: Stomach is unremarkable. No gastric wall thickening. No evidence of outlet obstruction. Duodenum is normally positioned as is the ligament of Treitz. No small bowel wall thickening. No small bowel dilatation. The terminal ileum is normal. The appendix is normal. No gross colonic mass. No colonic wall thickening. Moderate stool volume throughout. Vascular/Lymphatic: No abdominal aortic aneurysm. No abdominal aortic atherosclerotic calcification. There is no gastrohepatic or hepatoduodenal ligament lymphadenopathy. No retroperitoneal or mesenteric lymphadenopathy. No pelvic sidewall lymphadenopathy. Reproductive: Heterogeneous myometrial enhancement posterior fundal uterus may be related to fibroids. There is no adnexal mass. Other: Trace free fluid is identified in the cul-de-sac. 4.0 x 3.7 cm fluid density structure in the right upper quadrant just below the pylorus in medial to the gallbladder was 4.4 x 3.3 cm on a study from 06/19/2021 and was present but incompletely visualized on a chest CTA of 09/25/2017 where it was measured at 5-6 cm. Lesion has no perceptible wall or evidence for enhancing components. Given long-term persistence, features are most suggestive of benign etiology with lymphatics cyst/lymphangioma distinct consideration. Musculoskeletal: No worrisome lytic or sclerotic osseous abnormality. Review of the MIP images confirms the above findings. IMPRESSION: 1. No CT evidence for acute pulmonary embolus. 2. Enlargement of the pulmonary outflow tract/main pulmonary arteries suggests pulmonary arterial hypertension. 3. No acute findings in the abdomen or pelvis. Specifically, no findings to suggest acute pancreatitis. 4. 4.0 x 3.7 cm fluid density structure in the right upper quadrant just below the pylorus and medial  to the gallbladder was 4.4 x 3.3 cm on a study from 06/19/2021 and was present but incompletely visualized on a chest CTA of 09/25/2017 where it was measured at 5-6 cm. Given long-term persistence, features are most suggestive of benign etiology with lymphatics cyst/lymphangioma distinct consideration. 5. Trace free fluid in the cul-de-sac, likely physiologic. Electronically Signed   By: Kennith Center M.D.   On: 09/08/2023 06:43   DG Chest 1 View Result Date: 09/08/2023 CLINICAL DATA:  33 year old female with chest pain and dizziness, diaphoresis. EXAM: CHEST  1 VIEW COMPARISON:  CTA chest 09/25/2017. FINDINGS: Portable AP upright view at 0512 hours. Normal lung volumes and mediastinal contours. Visualized tracheal air column is within normal limits. Allowing for portable technique the lungs are clear. No pneumothorax or pleural effusion. No osseous abnormality identified.  Negative visible bowel gas. IMPRESSION: Negative portable chest. Electronically Signed   By: Odessa Fleming M.D.   On: 09/08/2023 05:38   Lanae Boast MD [facility]  If 7PM-7AM, please contact night-coverage [email or web address]  09/08/2023, 11:49 AM

## 2023-09-08 NOTE — Progress Notes (Signed)
 Plan of Care Note for accepted transfer  Patient: Beth Morrow    WUJ:[id number]  DOA: 09/08/2023     Nursing staff, Please call [facility] Admits & Consults System-Wide number on Amion as soon as patient's arrival to the unit (not the listed attending) so that the appropriate admitting provider can evaluate the pt. ASAP to avoid any delay in care.  Facility requesting transfer: [facility] Requesting Provider: Dr. Manus Gunning, Dr. Estanislado Pandy Reason for transfer: Admission Facility course: Patient with PMH of renal stones presented to the hospital with complaints of central chest pain.  Reported as a sudden onset.  EKG was unremarkable.  Troponin levels were negative.  D-dimer was mildly elevated.  CT chest PE protocol was negative for PE. Workup was positive for elevated lipase level, 1560.  EDP is not aware of any alcohol abuse history.  Patient receiving fluid.  CT abdomen does not show any evidence of significant pancreatitis.  Ultrasound ordered.  Recommended EDP to order triglyceride and alcohol level as well.  Patient was reported to be stable for MedSurg per ED provider.  Plan of care: The patient is accepted for admission to Med-surg  unit, at [facility]  Author: Lynden Oxford, MD  09/08/2023  Check [email or web address] for on-call coverage.

## 2023-09-08 NOTE — Plan of Care (Signed)
  Problem: Education: °Goal: Knowledge of General Education information will improve °Description: Including pain rating scale, medication(s)/side effects and non-pharmacologic comfort measures °Outcome: Progressing °  °Problem: Health Behavior/Discharge Planning: °Goal: Ability to manage health-related needs will improve °Outcome: Progressing °  °Problem: Clinical Measurements: °Goal: Ability to maintain clinical measurements within normal limits will improve °Outcome: Progressing °Goal: Will remain free from infection °Outcome: Progressing °  °Problem: Nutrition: °Goal: Adequate nutrition will be maintained °Outcome: Progressing °  °Problem: Elimination: °Goal: Will not experience complications related to bowel motility °Outcome: Progressing °  °

## 2023-09-08 NOTE — ED Notes (Addendum)
 Report attempted to the floor RN... They stated that they were busy and would call back.Marland KitchenMarland Kitchen

## 2023-09-08 NOTE — ED Notes (Signed)
 [facility] called for transport

## 2023-09-08 NOTE — ED Notes (Signed)
 Report given to the floor RN.

## 2023-09-08 NOTE — ED Provider Notes (Signed)
 [place] EMERGENCY DEPARTMENT AT [facility] Provider Note   CSN: [id number] Arrival date & time: 09/08/23  0435     History  Chief Complaint  Patient presents with   Chest Pain    Beth Morrow is a 33 y.o. female.  Patient with history of kidney stones here with central chest pain.  States she woke up from sleep about 2:30 AM with severe central pain in the center of her chest with radiation to her back.  This was associated with nausea, diaphoresis and clamminess.  Pain was severe for about 30 minutes and she called EMS.  They came to the house did an EKG and recommended transport to the hospital which she declined.  She then came to the hospital on her own with her family.  She still having some discomfort to the center of her chest but is not as severe.  Some associated shortness of breath, nausea but diaphoresis has resolved.  No abdominal pain.  No cough or fever. Before going to bed last evening she noticed some "heartburn" around 10:30 PM that she did not think much of it at the time.  The pain she is having now is in the same location but much more severe.  Denies any previous diagnosis of heartburn or gastritis. She does not take any PPI.  She is currently taking amoxicillin as well as Tylenol and ibuprofen for dental infections.  The history is provided by the patient and a parent.  Chest Pain Associated symptoms: back pain, diaphoresis, nausea and shortness of breath   Associated symptoms: no abdominal pain, no dizziness, no fever, no headache, no vomiting and no weakness        Home Medications Prior to Admission medications   Medication Sig Start Date End Date Taking? Authorizing Provider  HYDROcodone-acetaminophen (NORCO/VICODIN) 5-325 MG tablet Take 1 tablet by mouth every 4 (four) hours as needed. 06/23/21   Sloan Leiter, DO  naproxen (NAPROSYN) 375 MG tablet Take 1 tablet twice daily as needed for pain. 03/16/18   Molpus, John, MD  ondansetron  (ZOFRAN) 4 MG tablet Take 1 tablet (4 mg total) by mouth every 4 (four) hours as needed for nausea or vomiting. 06/23/21   Sloan Leiter, DO      Allergies    Patient has no known allergies.    Review of Systems   Review of Systems  Constitutional:  Positive for diaphoresis. Negative for activity change, appetite change and fever.  HENT:  Negative for congestion and rhinorrhea.   Respiratory:  Positive for chest tightness and shortness of breath.   Cardiovascular:  Positive for chest pain.  Gastrointestinal:  Positive for nausea. Negative for abdominal pain and vomiting.  Genitourinary:  Negative for dysuria and hematuria.  Musculoskeletal:  Positive for back pain. Negative for arthralgias and myalgias.  Neurological:  Negative for dizziness, weakness and headaches.    all other systems are negative except as noted in the HPI and PMH.   Physical Exam Updated Vital Signs BP (!) 142/85   Pulse 75   Temp 98.1 F (36.7 C)   Resp 18   SpO2 98%  Physical Exam Vitals and nursing note reviewed.  Constitutional:      General: She is not in acute distress.    Appearance: She is well-developed.  HENT:     Head: Normocephalic and atraumatic.     Mouth/Throat:     Pharynx: No oropharyngeal exudate.  Eyes:     Conjunctiva/sclera: Conjunctivae normal.  Pupils: Pupils are equal, round, and reactive to light.  Neck:     Comments: No meningismus. Cardiovascular:     Rate and Rhythm: Normal rate and regular rhythm.     Heart sounds: Normal heart sounds. No murmur heard. Pulmonary:     Effort: Pulmonary effort is normal. No respiratory distress.     Breath sounds: Normal breath sounds.  Chest:     Chest wall: No tenderness.  Abdominal:     Palpations: Abdomen is soft.     Tenderness: There is no abdominal tenderness. There is no guarding or rebound.  Musculoskeletal:        General: No tenderness. Normal range of motion.     Cervical back: Normal range of motion and neck supple.      Comments: Equal DP and PT pulses  Skin:    General: Skin is warm.  Neurological:     Mental Status: She is alert and oriented to person, place, and time.     Cranial Nerves: No cranial nerve deficit.     Motor: No abnormal muscle tone.     Coordination: Coordination normal.     Comments:  5/5 strength throughout. CN 2-12 intact.Equal grip strength.   Psychiatric:        Behavior: Behavior normal.     ED Results / Procedures / Treatments   Labs (all labs ordered are listed, but only abnormal results are displayed) Labs Reviewed  HEPATIC FUNCTION PANEL - Abnormal; Notable for the following components:      Result Value   Alkaline Phosphatase 31 (*)    All other components within normal limits  LIPASE, BLOOD - Abnormal; Notable for the following components:   Lipase 1,560 (*)    All other components within normal limits  D-DIMER, QUANTITATIVE - Abnormal; Notable for the following components:   D-Dimer, Quant 0.64 (*)    All other components within normal limits  BASIC METABOLIC PANEL WITH GFR  CBC  PREGNANCY, URINE  ETHANOL  TRIGLYCERIDES  TROPONIN I (HIGH SENSITIVITY)  TROPONIN I (HIGH SENSITIVITY)    EKG EKG Interpretation Date/Time:  Friday September 08 2023 04:43:06 EDT Ventricular Rate:  62 PR Interval:  138 QRS Duration:  94 QT Interval:  418 QTC Calculation: 425 R Axis:   71  Text Interpretation: Sinus rhythm No significant change was found Confirmed by Glynn Octave [phone]) on 09/08/2023 4:47:21 AM  Radiology CT ABDOMEN PELVIS W CONTRAST Result Date: 09/08/2023 CLINICAL DATA:  Positive D-dimer. Shortness of breath and chest pain. Elevated lipase. Clinical concern for pancreatitis. EXAM: CT ANGIOGRAPHY CHEST CT ABDOMEN AND PELVIS WITH CONTRAST TECHNIQUE: Multidetector CT imaging of the chest was performed using the standard protocol during bolus administration of intravenous contrast. Multiplanar CT image reconstructions and MIPs were obtained to evaluate the  vascular anatomy. Multidetector CT imaging of the abdomen and pelvis was performed using the standard protocol during bolus administration of intravenous contrast. RADIATION DOSE REDUCTION: This exam was performed according to the departmental dose-optimization program which includes automated exposure control, adjustment of the mA and/or kV according to patient size and/or use of iterative reconstruction technique. CONTRAST:  OMNIPAQUE IOHEXOL 350 MG/ML SOLN COMPARISON:  CT stone study 01/24/2022.  Chest CTA 09/25/2017 FINDINGS: CTA CHEST FINDINGS Cardiovascular: The heart size is upper normal to borderline enlarged. No substantial pericardial effusion. No thoracic aortic aneurysm. No substantial atherosclerosis of the thoracic aorta. Enlargement of the pulmonary outflow tract/main pulmonary arteries suggests pulmonary arterial hypertension. There is no filling defect within  the opacified pulmonary arteries to suggest the presence of an acute pulmonary embolus. Mediastinum/Nodes: No mediastinal lymphadenopathy. There is no hilar lymphadenopathy. The esophagus has normal imaging features. There is no axillary lymphadenopathy. Lungs/Pleura: No pulmonary edema or pleural effusion. No focal airspace consolidation. No suspicious pulmonary nodule or mass. Musculoskeletal: No worrisome lytic or sclerotic osseous abnormality. Review of the MIP images confirms the above findings. CT ABDOMEN and PELVIS FINDINGS Hepatobiliary: No suspicious focal abnormality within the liver parenchyma. There is no evidence for gallstones, gallbladder wall thickening, or pericholecystic fluid. No intrahepatic or extrahepatic biliary dilation. Pancreas: No focal mass lesion. No dilatation of the main duct. No intraparenchymal cyst. No peripancreatic edema. Spleen: No splenomegaly. No suspicious focal mass lesion. Adrenals/Urinary Tract: No adrenal nodule or mass. Right kidney unremarkable. Tiny well-defined homogeneous low-density lesion  in the left kidney is too small to characterize but is statistically most likely benign and probably a cyst. No followup imaging is recommended. No evidence for hydroureter. The urinary bladder appears normal for the degree of distention. Stomach/Bowel: Stomach is unremarkable. No gastric wall thickening. No evidence of outlet obstruction. Duodenum is normally positioned as is the ligament of Treitz. No small bowel wall thickening. No small bowel dilatation. The terminal ileum is normal. The appendix is normal. No gross colonic mass. No colonic wall thickening. Moderate stool volume throughout. Vascular/Lymphatic: No abdominal aortic aneurysm. No abdominal aortic atherosclerotic calcification. There is no gastrohepatic or hepatoduodenal ligament lymphadenopathy. No retroperitoneal or mesenteric lymphadenopathy. No pelvic sidewall lymphadenopathy. Reproductive: Heterogeneous myometrial enhancement posterior fundal uterus may be related to fibroids. There is no adnexal mass. Other: Trace free fluid is identified in the cul-de-sac. 4.0 x 3.7 cm fluid density structure in the right upper quadrant just below the pylorus in medial to the gallbladder was 4.4 x 3.3 cm on a study from 06/19/2021 and was present but incompletely visualized on a chest CTA of 09/25/2017 where it was measured at 5-6 cm. Lesion has no perceptible wall or evidence for enhancing components. Given long-term persistence, features are most suggestive of benign etiology with lymphatics cyst/lymphangioma distinct consideration. Musculoskeletal: No worrisome lytic or sclerotic osseous abnormality. Review of the MIP images confirms the above findings. IMPRESSION: 1. No CT evidence for acute pulmonary embolus. 2. Enlargement of the pulmonary outflow tract/main pulmonary arteries suggests pulmonary arterial hypertension. 3. No acute findings in the abdomen or pelvis. Specifically, no findings to suggest acute pancreatitis. 4. 4.0 x 3.7 cm fluid density  structure in the right upper quadrant just below the pylorus and medial to the gallbladder was 4.4 x 3.3 cm on a study from 06/19/2021 and was present but incompletely visualized on a chest CTA of 09/25/2017 where it was measured at 5-6 cm. Given long-term persistence, features are most suggestive of benign etiology with lymphatics cyst/lymphangioma distinct consideration. 5. Trace free fluid in the cul-de-sac, likely physiologic. Electronically Signed   By: Kennith Center M.D.   On: 09/08/2023 06:43   CT Angio Chest PE W and/or Wo Contrast Result Date: 09/08/2023 CLINICAL DATA:  Positive D-dimer. Shortness of breath and chest pain. Elevated lipase. Clinical concern for pancreatitis. EXAM: CT ANGIOGRAPHY CHEST CT ABDOMEN AND PELVIS WITH CONTRAST TECHNIQUE: Multidetector CT imaging of the chest was performed using the standard protocol during bolus administration of intravenous contrast. Multiplanar CT image reconstructions and MIPs were obtained to evaluate the vascular anatomy. Multidetector CT imaging of the abdomen and pelvis was performed using the standard protocol during bolus administration of intravenous contrast. RADIATION DOSE REDUCTION: This  exam was performed according to the departmental dose-optimization program which includes automated exposure control, adjustment of the mA and/or kV according to patient size and/or use of iterative reconstruction technique. CONTRAST:  OMNIPAQUE IOHEXOL 350 MG/ML SOLN COMPARISON:  CT stone study 01/24/2022.  Chest CTA 09/25/2017 FINDINGS: CTA CHEST FINDINGS Cardiovascular: The heart size is upper normal to borderline enlarged. No substantial pericardial effusion. No thoracic aortic aneurysm. No substantial atherosclerosis of the thoracic aorta. Enlargement of the pulmonary outflow tract/main pulmonary arteries suggests pulmonary arterial hypertension. There is no filling defect within the opacified pulmonary arteries to suggest the presence of an acute  pulmonary embolus. Mediastinum/Nodes: No mediastinal lymphadenopathy. There is no hilar lymphadenopathy. The esophagus has normal imaging features. There is no axillary lymphadenopathy. Lungs/Pleura: No pulmonary edema or pleural effusion. No focal airspace consolidation. No suspicious pulmonary nodule or mass. Musculoskeletal: No worrisome lytic or sclerotic osseous abnormality. Review of the MIP images confirms the above findings. CT ABDOMEN and PELVIS FINDINGS Hepatobiliary: No suspicious focal abnormality within the liver parenchyma. There is no evidence for gallstones, gallbladder wall thickening, or pericholecystic fluid. No intrahepatic or extrahepatic biliary dilation. Pancreas: No focal mass lesion. No dilatation of the main duct. No intraparenchymal cyst. No peripancreatic edema. Spleen: No splenomegaly. No suspicious focal mass lesion. Adrenals/Urinary Tract: No adrenal nodule or mass. Right kidney unremarkable. Tiny well-defined homogeneous low-density lesion in the left kidney is too small to characterize but is statistically most likely benign and probably a cyst. No followup imaging is recommended. No evidence for hydroureter. The urinary bladder appears normal for the degree of distention. Stomach/Bowel: Stomach is unremarkable. No gastric wall thickening. No evidence of outlet obstruction. Duodenum is normally positioned as is the ligament of Treitz. No small bowel wall thickening. No small bowel dilatation. The terminal ileum is normal. The appendix is normal. No gross colonic mass. No colonic wall thickening. Moderate stool volume throughout. Vascular/Lymphatic: No abdominal aortic aneurysm. No abdominal aortic atherosclerotic calcification. There is no gastrohepatic or hepatoduodenal ligament lymphadenopathy. No retroperitoneal or mesenteric lymphadenopathy. No pelvic sidewall lymphadenopathy. Reproductive: Heterogeneous myometrial enhancement posterior fundal uterus may be related to fibroids.  There is no adnexal mass. Other: Trace free fluid is identified in the cul-de-sac. 4.0 x 3.7 cm fluid density structure in the right upper quadrant just below the pylorus in medial to the gallbladder was 4.4 x 3.3 cm on a study from 06/19/2021 and was present but incompletely visualized on a chest CTA of 09/25/2017 where it was measured at 5-6 cm. Lesion has no perceptible wall or evidence for enhancing components. Given long-term persistence, features are most suggestive of benign etiology with lymphatics cyst/lymphangioma distinct consideration. Musculoskeletal: No worrisome lytic or sclerotic osseous abnormality. Review of the MIP images confirms the above findings. IMPRESSION: 1. No CT evidence for acute pulmonary embolus. 2. Enlargement of the pulmonary outflow tract/main pulmonary arteries suggests pulmonary arterial hypertension. 3. No acute findings in the abdomen or pelvis. Specifically, no findings to suggest acute pancreatitis. 4. 4.0 x 3.7 cm fluid density structure in the right upper quadrant just below the pylorus and medial to the gallbladder was 4.4 x 3.3 cm on a study from 06/19/2021 and was present but incompletely visualized on a chest CTA of 09/25/2017 where it was measured at 5-6 cm. Given long-term persistence, features are most suggestive of benign etiology with lymphatics cyst/lymphangioma distinct consideration. 5. Trace free fluid in the cul-de-sac, likely physiologic. Electronically Signed   By: Kennith Center M.D.   On: 09/08/2023 06:43  DG Chest 1 View Result Date: 09/08/2023 CLINICAL DATA:  33 year old female with chest pain and dizziness, diaphoresis. EXAM: CHEST  1 VIEW COMPARISON:  CTA chest 09/25/2017. FINDINGS: Portable AP upright view at 0512 hours. Normal lung volumes and mediastinal contours. Visualized tracheal air column is within normal limits. Allowing for portable technique the lungs are clear. No pneumothorax or pleural effusion. No osseous abnormality identified.   Negative visible bowel gas. IMPRESSION: Negative portable chest. Electronically Signed   By: Odessa Fleming M.D.   On: 09/08/2023 05:38    Procedures Procedures    Medications Ordered in ED Medications - No data to display  ED Course/ Medical Decision Making/ A&P                                 Medical Decision Making Amount and/or Complexity of Data Reviewed Labs: ordered. Decision-making details documented in ED Course. Radiology: ordered and independent interpretation performed. Decision-making details documented in ED Course. ECG/medicine tests: ordered and independent interpretation performed. Decision-making details documented in ED Course.  Risk OTC drugs. Prescription drug management. Decision regarding hospitalization.   Central chest pain first onset about 10 PM last night became acutely worse this morning about 2:30 AM associate with shortness of breath, nausea and diaphoresis.  Pain is starting to improve.  EKG is sinus rhythm without acute ST changes  Chest pain is not reproducible.  Will treat with GI cocktail given suspicion for esophagitis or GERD component pain.  Will obtain basic labs.  Troponin is negative.  D-dimer is slightly elevated at 0.64. Lipase significant elevated at 1500.  LFTs are normal.  Patient reports no history of pancreatitis but there was apparently some concern 6 or 7 years ago when she was pregnant.  Her testing at that time was normal.  She does drink alcohol but not an excessive basis.  Still has gallbladder.  CT scan is obtained to further evaluate elevated lipase and her chest pain.  This shows no evidence of pancreatitis.  Does show evidence of pulmonary hypertension but no pulmonary embolism.  Fluid-filled structure in right upper quadrant stable per radiology and likely favored to be lymphangioma or cyst.  LFTs are normal.  No leukocytosis.  Her chest pain has improved but not resolved.  Will continue IV hydration plan admission for  further evaluation of her elevated lipase and right upper quadrant fluid collection. Troponins remain negative with low suspicion for ACS.  Patient agrees and doesn't feel comfortable going home with ongoing symptoms.  Consult call pending at shift change.         Final Clinical Impression(s) / ED Diagnoses Final diagnoses:  Elevated lipase    Rx / DC Orders ED Discharge Orders     None         Nawaal Alling, Jeannett Senior, MD 09/08/23 [phone]

## 2023-09-08 NOTE — Plan of Care (Signed)

## 2023-09-08 NOTE — Hospital Course (Addendum)
 58 female with past medical history of renal stone, GERD, current dental infection taking amoxicillin/Tylenol/ibuprofen presented to the ED with central chest pain of sudden onset that woke her up at 230 also got dizzy and diaphoretic.  EMS was called and brought to the ED.  Of note patient had "heartburn" around 10:20 PM before she went to the bed. In the ED chest pain is nonreproducible, given GI cocktail due to concern for esophagitis/GERD routine labs were obtained troponin negative D-dimer 0.6 slightly elevated lipase significantly high 1500 with normal LFTs.  Troponin negative x 2 pregnancy test negative hemodynamically stable.  Patient was diagnosed with acute pancreatitis and transferred to [facility] for further admission.  Patient received fluid, pain medication. On exam this morning 09/08/23 she is alert awake oriented, remains hemodynamically stable.Her nausea has subsided. Patient otherwise denies any vomiting, chest pain, shortness of breath, fever, chills, headache, focal weakness, numbness tingling, speech difficulties  Patient was admitted and managed conservatively diet slowly advanced at this time she remains asymptomatic tolerating diet and stable for discharge.  Advised outpatient follow-up with PCP as well as general surgery  Procedures/testing: CTA chest/CT abdomen > no PE.. Enlargement of the pulmonary outflow tract/main pulmonary arteries suggests pulmonary arterial hypertension.  No acute finding in the abdomen or pelvis no finding to suggest acute pancreatitis. 4.0 x 3.7 cm fluid density structure in the right upper quadrant just below the pylorus and medial to the gallbladder was 4.4 x 3.3 cm on a study from 06/19/2021 and was present but incompletely visualized on a chest CTA of 09/25/2017 where it was measured at 5-6 cm. Given long-term persistence, features are most suggestive of benign etiology.  Ultrasound abdomen RUQ> cholelithiasis without acute cholecystitis.  Discharge  diagnosis  Acute pancreatitis Heartburn/chest pain Gerd: Initially presented with heartburn/chest pain workup showed acute pancreatitis based upon labs.No PE,troponin negative x 2, CTA chest abdomen pelvis no acute finding-besides benign fluid density in the right upper quadrant present before. She had relief from the gi cocktail it seems.  Manage symptomatically with IV fluids, diet slowly advance at this time she is tolerating diet and she feels ready for discharge. Ethanol undetectable, TG normal  Chest pain at home Pulmonary hypertension based on CTA: Troponin negative x2 no acute finding.  Pain improved with Maalox and other therapy. Echo being obtained prior to discharge and lvef 70-75%m no RWMA, RVSP 24, normal pulm art systolic pressure. I have requested cardiologist during outpatient follow-up and evaluation  Asymptomatic cholelithiasis: She is advised to fu PCP/ Surgery-I have provided office number for [facility] surgery.  Recent dental infection: Patient was taking amoxicillin NSAIDs and Tylenol PTA

## 2023-09-08 NOTE — ED Triage Notes (Signed)
 Pt is ambulatory into patient room. She mentions waking up at 230 and having chest pain that caused her to get dizzy as well and diaphoretic. She had some minor chest pain that also occurred earlier in the night as well that she attributed to GERD. She is otherwise stable at this time.

## 2023-09-09 ENCOUNTER — Observation Stay (HOSPITAL_BASED_OUTPATIENT_CLINIC_OR_DEPARTMENT_OTHER)

## 2023-09-09 ENCOUNTER — Other Ambulatory Visit (HOSPITAL_COMMUNITY): Payer: Self-pay

## 2023-09-09 DIAGNOSIS — K859 Acute pancreatitis without necrosis or infection, unspecified: Secondary | ICD-10-CM | POA: Diagnosis not present

## 2023-09-09 DIAGNOSIS — R079 Chest pain, unspecified: Secondary | ICD-10-CM | POA: Diagnosis not present

## 2023-09-09 LAB — CBC
HCT: 36.9 % (ref 36.0–46.0)
Hemoglobin: 12.2 g/dL (ref 12.0–15.0)
MCH: 30.6 pg (ref 26.0–34.0)
MCHC: 33.1 g/dL (ref 30.0–36.0)
MCV: 92.5 fL (ref 80.0–100.0)
Platelets: 324 10*3/uL (ref 150–400)
RBC: 3.99 MIL/uL (ref 3.87–5.11)
RDW: 13.1 % (ref 11.5–15.5)
WBC: 4.1 10*3/uL (ref 4.0–10.5)
nRBC: 0 % (ref 0.0–0.2)

## 2023-09-09 LAB — COMPREHENSIVE METABOLIC PANEL WITH GFR
ALT: 24 U/L (ref 0–44)
AST: 21 U/L (ref 15–41)
Albumin: 2.8 g/dL — ABNORMAL LOW (ref 3.5–5.0)
Alkaline Phosphatase: 29 U/L — ABNORMAL LOW (ref 38–126)
Anion gap: 8 (ref 5–15)
BUN: <5 mg/dL — ABNORMAL LOW (ref 6–20)
CO2: 25 mmol/L (ref 22–32)
Calcium: 8.9 mg/dL (ref 8.9–10.3)
Chloride: 106 mmol/L (ref 98–111)
Creatinine, Ser: 0.74 mg/dL (ref 0.44–1.00)
GFR, Estimated: >60 mL/min (ref >60)
Glucose, Bld: 92 mg/dL (ref 70–99)
Potassium: 3.8 mmol/L (ref 3.5–5.1)
Sodium: 139 mmol/L (ref 135–145)
Total Bilirubin: 0.6 mg/dL (ref 0.0–1.2)
Total Protein: 6 g/dL — ABNORMAL LOW (ref 6.5–8.1)

## 2023-09-09 LAB — ECHOCARDIOGRAM COMPLETE
Area-P 1/2: 3.51 cm2
Calc EF: 76.5 %
Height: 63 in
S' Lateral: 2.4 cm
Single Plane A2C EF: 70.5 %
Single Plane A4C EF: 81.4 %
Weight: 2448 [oz_av]

## 2023-09-09 LAB — LIPASE, BLOOD: Lipase: 52 U/L — ABNORMAL HIGH (ref 11–51)

## 2023-09-09 MED ORDER — PANTOPRAZOLE SODIUM 40 MG PO TBEC
40.0000 mg | DELAYED_RELEASE_TABLET | Freq: Every day | ORAL | 11 refills | Status: AC
  Filled 2023-09-09: qty 30, 30d supply, fill #0

## 2023-09-09 MED ORDER — ALUM & MAG HYDROXIDE-SIMETH 200-200-20 MG/5ML PO SUSP
30.0000 mL | Freq: Four times a day (QID) | ORAL | 0 refills | Status: AC | PRN
  Filled 2023-09-09: qty 355, 3d supply, fill #0

## 2023-09-09 NOTE — Discharge Summary (Signed)
 Physician Discharge Summary  Beth Morrow ZOX:[id number] DOB: January 05, 1991 DOA: 09/08/2023  PCP: [facility]  Admit date: 09/08/2023 Discharge date: 09/09/2023 Recommendations for Outpatient Follow-up:  Follow up with PCP in 1 weeks-call for appointment Follow-up with cardiology as outpatient  For general surgery regarding gallbladder stone Please obtain BMP/CBC in one week  Discharge Dispo: Home Discharge Condition: Stable Code Status:   Code Status: Full Code Diet recommendation:  Diet Order             Diet Heart Room service appropriate? Yes; Fluid consistency: Thin  Diet effective now                    Brief/Interim Summary: 33 female with past medical history of renal stone, GERD, current dental infection taking amoxicillin/Tylenol/ibuprofen presented to the ED with central chest pain of sudden onset that woke her up at 230 also got dizzy and diaphoretic.  EMS was called and brought to the ED.  Of note patient had "heartburn" around 10:20 PM before she went to the bed. In the ED chest pain is nonreproducible, given GI cocktail due to concern for esophagitis/GERD routine labs were obtained troponin negative D-dimer 0.6 slightly elevated lipase significantly high 1500 with normal LFTs.  Troponin negative x 2 pregnancy test negative hemodynamically stable.  Patient was diagnosed with acute pancreatitis and transferred to [facility] for further admission.  Patient received fluid, pain medication. On exam this morning 09/08/23 she is alert awake oriented, remains hemodynamically stable.Her nausea has subsided. Patient otherwise denies any vomiting, chest pain, shortness of breath, fever, chills, headache, focal weakness, numbness tingling, speech difficulties  Patient was admitted and managed conservatively diet slowly advanced at this time she remains asymptomatic tolerating diet and stable for discharge.  Advised outpatient follow-up with PCP as well as  general surgery  Procedures/testing: CTA chest/CT abdomen > no PE.. Enlargement of the pulmonary outflow tract/main pulmonary arteries suggests pulmonary arterial hypertension.  No acute finding in the abdomen or pelvis no finding to suggest acute pancreatitis. 4.0 x 3.7 cm fluid density structure in the right upper quadrant just below the pylorus and medial to the gallbladder was 4.4 x 3.3 cm on a study from 06/19/2021 and was present but incompletely visualized on a chest CTA of 09/25/2017 where it was measured at 5-6 cm. Given long-term persistence, features are most suggestive of benign etiology.  Ultrasound abdomen RUQ> cholelithiasis without acute cholecystitis.  Discharge diagnosis  Acute pancreatitis Heartburn/chest pain Gerd: Initially presented with heartburn/chest pain workup showed acute pancreatitis based upon labs.No PE,troponin negative x 2, CTA chest abdomen pelvis no acute finding-besides benign fluid density in the right upper quadrant present before. She had relief from the gi cocktail it seems.  Manage symptomatically with IV fluids, diet slowly advance at this time she is tolerating diet and she feels ready for discharge. Ethanol undetectable, TG normal  Chest pain at home Pulmonary hypertension based on CTA: Troponin negative x2 no acute finding.  Pain improved with Maalox and other therapy. Echo being obtained prior to discharge and lvef 70-75%m no RWMA, RVSP 24, normal pulm art systolic pressure. I have requested cardiologist during outpatient follow-up and evaluation  Asymptomatic cholelithiasis: She is advised to fu PCP/ Surgery-I have provided office number for [facility] surgery.  Recent dental infection: Patient was taking amoxicillin NSAIDs and Tylenol PTA   Discharge Exam: Vitals:   09/08/23 2241 09/09/23 0402  BP: 113/78 115/72  Pulse: (!) 54 (!) 51  Resp:  17 16  Temp: 98.1 F (36.7 C) 98.2 F (36.8 C)  SpO2: 100% 100%   General: Pt is  alert, awake, not in acute distress Cardiovascular: RRR, S1/S2 +, no rubs, no gallops Respiratory: CTA bilaterally, no wheezing, no rhonchi Abdominal: Soft, NT, ND, bowel sounds + Extremities: no edema, no cyanosis  Discharge Instructions  Discharge Instructions     Discharge instructions   Complete by: As directed    Please call call MD or return to ER for similar or worsening recurring problem that brought you to hospital or if any fever,nausea/vomiting,abdominal pain, uncontrolled pain, chest pain,  shortness of breath or any other alarming symptoms.  Please follow-up your doctor as instructed in a week time and call the office for appointment.  Please avoid alcohol, smoking, or any other illicit substance and maintain healthy habits including taking your regular medications as prescribed.  You were cared for by a hospitalist during your hospital stay. If you have any questions about your discharge medications or the care you received while you were in the hospital after you are discharged, you can call the unit and ask to speak with the hospitalist on call if the hospitalist that took care of you is not available.  Once you are discharged, your primary care physician will handle any further medical issues. Please note that NO REFILLS for any discharge medications will be authorized once you are discharged, as it is imperative that you return to your primary care physician (or establish a relationship with a primary care physician if you do not have one) for your aftercare needs so that they can reassess your need for medications and monitor your lab values   Increase activity slowly   Complete by: As directed       Allergies as of 09/09/2023   No Known Allergies      Medication List     STOP taking these medications    ibuprofen 800 MG tablet Commonly known as: ADVIL       TAKE these medications    alum & mag hydroxide-simeth 200-200-20 MG/5ML suspension Commonly known  as: MAALOX/MYLANTA Take 30 mLs by mouth every 6 (six) hours as needed for indigestion or heartburn.   amoxicillin-clavulanate 875-125 MG tablet Commonly known as: AUGMENTIN Take 1 tablet by mouth 2 (two) times daily.   Apri 0.15-30 MG-MCG tablet Generic drug: desogestrel-ethinyl estradiol Take 1 tablet by mouth daily.   chlorhexidine 0.12 % solution Commonly known as: PERIDEX Use as directed 15 mLs in the mouth or throat 2 (two) times daily.   pantoprazole 40 MG tablet Commonly known as: Protonix Take 1 tablet (40 mg total) by mouth daily.        Follow-up Information     [facility] Follow up in 1 week(s).   Specialty: Family Medicine Contact information: [address] [place] [address] [phone]         [facility] Follow up in 1 week(s).   Why: Call for appointment for cholelithiasis Contact information: [address] [place] [address]        [facility] Follow up.   Why: Dr. Jonathon Bellows recommended referral to cardiology as an outpatient. The office will call you for your followup appointment.               No Known Allergies  The results of significant diagnostics from this hospitalization (including imaging, microbiology, ancillary and laboratory) are  listed below for reference.    Microbiology: No results found for this or any previous visit (from the past 240 hours).  Procedures/Studies: ECHOCARDIOGRAM COMPLETE Result Date: 09/09/2023    ECHOCARDIOGRAM REPORT   Patient Name:   Beth Morrow Date of Exam: 09/09/2023 Medical Rec #:  [id number]          Height:       63.0 in Accession #:    [id number]         Weight:       153.0 lb Date of Birth:  June 04, 1991          BSA:          1.726 m Patient Age:    32 years           BP:           115/72 mmHg Patient Gender: F                  HR:           54 bpm. Exam Location:  Inpatient Procedure: 2D  Echo, Cardiac Doppler and Color Doppler (Both Spectral and Color            Flow Doppler were utilized during procedure). Indications:    R07.9* Chest pain, unspecified  History:        Patient has no prior history of Echocardiogram examinations.                 Signs/Symptoms:Chest Pain and Dizziness/Lightheadedness.  Sonographer:    Sheralyn Boatman RDCS Referring Phys: [id number] [facility]  Sonographer Comments: Suboptimal apical window. IMPRESSIONS  1. Left ventricular ejection fraction, by estimation, is 70 to 75%. The left ventricle has hyperdynamic function. The left ventricle has no regional wall motion abnormalities. Left ventricular diastolic parameters were normal.  2. Right ventricular systolic function is normal. The right ventricular size is normal. There is normal pulmonary artery systolic pressure. The estimated right ventricular systolic pressure is 24.0 mmHg.  3. The mitral valve is normal in structure. Mild mitral valve regurgitation.  4. The aortic valve is tricuspid. Aortic valve regurgitation is not visualized. Aortic valve sclerosis is present, with no evidence of aortic valve stenosis.  5. The inferior vena cava is normal in size with greater than 50% respiratory variability, suggesting right atrial pressure of 3 mmHg. FINDINGS  Left Ventricle: Left ventricular ejection fraction, by estimation, is 70 to 75%. The left ventricle has hyperdynamic function. The left ventricle has no regional wall motion abnormalities. The left ventricular internal cavity size was normal in size. There is no left ventricular hypertrophy. Left ventricular diastolic parameters were normal. Right Ventricle: The right ventricular size is normal. Right vetricular wall thickness was not assessed. Right ventricular systolic function is normal. There is normal pulmonary artery systolic pressure. The tricuspid regurgitant velocity is 2.29 m/s, and with an assumed right atrial pressure of 3 mmHg, the estimated right ventricular  systolic pressure is 24.0 mmHg. Left Atrium: Left atrial size was normal in size. Right Atrium: Right atrial size was normal in size. Pericardium: There is no evidence of pericardial effusion. Mitral Valve: The mitral valve is normal in structure. Mild mitral valve regurgitation. Tricuspid Valve: The tricuspid valve is normal in structure. Tricuspid valve regurgitation is mild. Aortic Valve: The aortic valve is tricuspid. Aortic valve regurgitation is not visualized. Aortic valve sclerosis is present, with no evidence of aortic valve stenosis. Pulmonic Valve: The pulmonic valve was normal in structure. Pulmonic valve regurgitation  is trivial. Aorta: The aortic root is normal in size and structure. Venous: The inferior vena cava is normal in size with greater than 50% respiratory variability, suggesting right atrial pressure of 3 mmHg. IAS/Shunts: No atrial level shunt detected by color flow Doppler.  LEFT VENTRICLE PLAX 2D LVIDd:         4.50 cm     Diastology LVIDs:         2.40 cm     LV e' medial:    12.30 cm/s LV PW:         0.90 cm     LV E/e' medial:  8.1 LV IVS:        0.80 cm     LV e' lateral:   15.30 cm/s LVOT diam:     2.10 cm     LV E/e' lateral: 6.5 LV SV:         97 LV SV Index:   56 LVOT Area:     3.46 cm  LV Volumes (MOD) LV vol d, MOD A2C: 86.2 ml LV vol d, MOD A4C: 71.5 ml LV vol s, MOD A2C: 25.4 ml LV vol s, MOD A4C: 13.3 ml LV SV MOD A2C:     60.8 ml LV SV MOD A4C:     71.5 ml LV SV MOD BP:      61.0 ml RIGHT VENTRICLE             IVC RV S prime:     13.20 cm/s  IVC diam: 1.90 cm TAPSE (M-mode): 2.6 cm LEFT ATRIUM             Index        RIGHT ATRIUM           Index LA diam:        3.20 cm 1.85 cm/m   RA Area:     15.20 cm LA Vol (A2C):   21.7 ml 12.58 ml/m  RA Volume:   43.00 ml  24.92 ml/m LA Vol (A4C):   17.9 ml 10.37 ml/m LA Biplane Vol: 19.8 ml 11.47 ml/m  AORTIC VALVE             PULMONIC VALVE LVOT Vmax:   135.00 cm/s PR End Diast Vel: 1.38 msec LVOT Vmean:  86.900 cm/s LVOT VTI:     0.281 m  AORTA Ao Root diam: 2.70 cm Ao Asc diam:  2.90 cm MITRAL VALVE               TRICUSPID VALVE MV Area (PHT): 3.51 cm    TR Peak grad:   21.0 mmHg MV Decel Time: 216 msec    TR Vmax:        229.00 cm/s MV E velocity: 99.80 cm/s MV A velocity: 53.30 cm/s  SHUNTS MV E/A ratio:  1.87        Systemic VTI:  0.28 m                            Systemic Diam: 2.10 cm Dietrich Pates MD Electronically signed by Dietrich Pates MD Signature Date/Time: 09/09/2023/3:05:27 PM    Final    US Abdomen Limited RUQ (LIVER/GB) Result Date: 09/08/2023 CLINICAL DATA:  Right upper quadrant pain. EXAM: ULTRASOUND ABDOMEN LIMITED RIGHT UPPER QUADRANT COMPARISON:  CT scan earlier same day FINDINGS: Gallbladder: Multiple gallstones evident measuring up to 7-8 mm maximum size. Although these stones demonstrate shadowing on ultrasound, review of recent CT  shows that they are uncalcified. No substantial gallbladder wall thickening or pericholecystic fluid. The sonographer reports no sonographic Murphy sign. Common bile duct: Diameter: 3 mm Liver: No focal lesion identified. Within normal limits in parenchymal echogenicity. Portal vein is patent on color Doppler imaging with normal direction of blood flow towards the liver. Other: 3.7 x 2.9 cm cystic structure in the right upper quadrant of the abdomen was better characterized on CT performed prior to this study. IMPRESSION: Cholelithiasis without sonographic evidence for acute cholecystitis. Electronically Signed   By: Kennith Center M.D.   On: 09/08/2023 08:10   CT ABDOMEN PELVIS W CONTRAST Result Date: 09/08/2023 CLINICAL DATA:  Positive D-dimer. Shortness of breath and chest pain. Elevated lipase. Clinical concern for pancreatitis. EXAM: CT ANGIOGRAPHY CHEST CT ABDOMEN AND PELVIS WITH CONTRAST TECHNIQUE: Multidetector CT imaging of the chest was performed using the standard protocol during bolus administration of intravenous contrast. Multiplanar CT image reconstructions and MIPs were  obtained to evaluate the vascular anatomy. Multidetector CT imaging of the abdomen and pelvis was performed using the standard protocol during bolus administration of intravenous contrast. RADIATION DOSE REDUCTION: This exam was performed according to the departmental dose-optimization program which includes automated exposure control, adjustment of the mA and/or kV according to patient size and/or use of iterative reconstruction technique. CONTRAST:  OMNIPAQUE IOHEXOL 350 MG/ML SOLN COMPARISON:  CT stone study 01/24/2022.  Chest CTA 09/25/2017 FINDINGS: CTA CHEST FINDINGS Cardiovascular: The heart size is upper normal to borderline enlarged. No substantial pericardial effusion. No thoracic aortic aneurysm. No substantial atherosclerosis of the thoracic aorta. Enlargement of the pulmonary outflow tract/main pulmonary arteries suggests pulmonary arterial hypertension. There is no filling defect within the opacified pulmonary arteries to suggest the presence of an acute pulmonary embolus. Mediastinum/Nodes: No mediastinal lymphadenopathy. There is no hilar lymphadenopathy. The esophagus has normal imaging features. There is no axillary lymphadenopathy. Lungs/Pleura: No pulmonary edema or pleural effusion. No focal airspace consolidation. No suspicious pulmonary nodule or mass. Musculoskeletal: No worrisome lytic or sclerotic osseous abnormality. Review of the MIP images confirms the above findings. CT ABDOMEN and PELVIS FINDINGS Hepatobiliary: No suspicious focal abnormality within the liver parenchyma. There is no evidence for gallstones, gallbladder wall thickening, or pericholecystic fluid. No intrahepatic or extrahepatic biliary dilation. Pancreas: No focal mass lesion. No dilatation of the main duct. No intraparenchymal cyst. No peripancreatic edema. Spleen: No splenomegaly. No suspicious focal mass lesion. Adrenals/Urinary Tract: No adrenal nodule or mass. Right kidney unremarkable. Tiny well-defined  homogeneous low-density lesion in the left kidney is too small to characterize but is statistically most likely benign and probably a cyst. No followup imaging is recommended. No evidence for hydroureter. The urinary bladder appears normal for the degree of distention. Stomach/Bowel: Stomach is unremarkable. No gastric wall thickening. No evidence of outlet obstruction. Duodenum is normally positioned as is the ligament of Treitz. No small bowel wall thickening. No small bowel dilatation. The terminal ileum is normal. The appendix is normal. No gross colonic mass. No colonic wall thickening. Moderate stool volume throughout. Vascular/Lymphatic: No abdominal aortic aneurysm. No abdominal aortic atherosclerotic calcification. There is no gastrohepatic or hepatoduodenal ligament lymphadenopathy. No retroperitoneal or mesenteric lymphadenopathy. No pelvic sidewall lymphadenopathy. Reproductive: Heterogeneous myometrial enhancement posterior fundal uterus may be related to fibroids. There is no adnexal mass. Other: Trace free fluid is identified in the cul-de-sac. 4.0 x 3.7 cm fluid density structure in the right upper quadrant just below the pylorus in medial to the gallbladder was 4.4 x 3.3 cm  on a study from 06/19/2021 and was present but incompletely visualized on a chest CTA of 09/25/2017 where it was measured at 5-6 cm. Lesion has no perceptible wall or evidence for enhancing components. Given long-term persistence, features are most suggestive of benign etiology with lymphatics cyst/lymphangioma distinct consideration. Musculoskeletal: No worrisome lytic or sclerotic osseous abnormality. Review of the MIP images confirms the above findings. IMPRESSION: 1. No CT evidence for acute pulmonary embolus. 2. Enlargement of the pulmonary outflow tract/main pulmonary arteries suggests pulmonary arterial hypertension. 3. No acute findings in the abdomen or pelvis. Specifically, no findings to suggest acute pancreatitis. 4.  4.0 x 3.7 cm fluid density structure in the right upper quadrant just below the pylorus and medial to the gallbladder was 4.4 x 3.3 cm on a study from 06/19/2021 and was present but incompletely visualized on a chest CTA of 09/25/2017 where it was measured at 5-6 cm. Given long-term persistence, features are most suggestive of benign etiology with lymphatics cyst/lymphangioma distinct consideration. 5. Trace free fluid in the cul-de-sac, likely physiologic. Electronically Signed   By: Kennith Center M.D.   On: 09/08/2023 06:43   CT Angio Chest PE W and/or Wo Contrast Result Date: 09/08/2023 CLINICAL DATA:  Positive D-dimer. Shortness of breath and chest pain. Elevated lipase. Clinical concern for pancreatitis. EXAM: CT ANGIOGRAPHY CHEST CT ABDOMEN AND PELVIS WITH CONTRAST TECHNIQUE: Multidetector CT imaging of the chest was performed using the standard protocol during bolus administration of intravenous contrast. Multiplanar CT image reconstructions and MIPs were obtained to evaluate the vascular anatomy. Multidetector CT imaging of the abdomen and pelvis was performed using the standard protocol during bolus administration of intravenous contrast. RADIATION DOSE REDUCTION: This exam was performed according to the departmental dose-optimization program which includes automated exposure control, adjustment of the mA and/or kV according to patient size and/or use of iterative reconstruction technique. CONTRAST:  OMNIPAQUE IOHEXOL 350 MG/ML SOLN COMPARISON:  CT stone study 01/24/2022.  Chest CTA 09/25/2017 FINDINGS: CTA CHEST FINDINGS Cardiovascular: The heart size is upper normal to borderline enlarged. No substantial pericardial effusion. No thoracic aortic aneurysm. No substantial atherosclerosis of the thoracic aorta. Enlargement of the pulmonary outflow tract/main pulmonary arteries suggests pulmonary arterial hypertension. There is no filling defect within the opacified pulmonary arteries to suggest the  presence of an acute pulmonary embolus. Mediastinum/Nodes: No mediastinal lymphadenopathy. There is no hilar lymphadenopathy. The esophagus has normal imaging features. There is no axillary lymphadenopathy. Lungs/Pleura: No pulmonary edema or pleural effusion. No focal airspace consolidation. No suspicious pulmonary nodule or mass. Musculoskeletal: No worrisome lytic or sclerotic osseous abnormality. Review of the MIP images confirms the above findings. CT ABDOMEN and PELVIS FINDINGS Hepatobiliary: No suspicious focal abnormality within the liver parenchyma. There is no evidence for gallstones, gallbladder wall thickening, or pericholecystic fluid. No intrahepatic or extrahepatic biliary dilation. Pancreas: No focal mass lesion. No dilatation of the main duct. No intraparenchymal cyst. No peripancreatic edema. Spleen: No splenomegaly. No suspicious focal mass lesion. Adrenals/Urinary Tract: No adrenal nodule or mass. Right kidney unremarkable. Tiny well-defined homogeneous low-density lesion in the left kidney is too small to characterize but is statistically most likely benign and probably a cyst. No followup imaging is recommended. No evidence for hydroureter. The urinary bladder appears normal for the degree of distention. Stomach/Bowel: Stomach is unremarkable. No gastric wall thickening. No evidence of outlet obstruction. Duodenum is normally positioned as is the ligament of Treitz. No small bowel wall thickening. No small bowel dilatation. The terminal ileum is normal.  The appendix is normal. No gross colonic mass. No colonic wall thickening. Moderate stool volume throughout. Vascular/Lymphatic: No abdominal aortic aneurysm. No abdominal aortic atherosclerotic calcification. There is no gastrohepatic or hepatoduodenal ligament lymphadenopathy. No retroperitoneal or mesenteric lymphadenopathy. No pelvic sidewall lymphadenopathy. Reproductive: Heterogeneous myometrial enhancement posterior fundal uterus may be  related to fibroids. There is no adnexal mass. Other: Trace free fluid is identified in the cul-de-sac. 4.0 x 3.7 cm fluid density structure in the right upper quadrant just below the pylorus in medial to the gallbladder was 4.4 x 3.3 cm on a study from 06/19/2021 and was present but incompletely visualized on a chest CTA of 09/25/2017 where it was measured at 5-6 cm. Lesion has no perceptible wall or evidence for enhancing components. Given long-term persistence, features are most suggestive of benign etiology with lymphatics cyst/lymphangioma distinct consideration. Musculoskeletal: No worrisome lytic or sclerotic osseous abnormality. Review of the MIP images confirms the above findings. IMPRESSION: 1. No CT evidence for acute pulmonary embolus. 2. Enlargement of the pulmonary outflow tract/main pulmonary arteries suggests pulmonary arterial hypertension. 3. No acute findings in the abdomen or pelvis. Specifically, no findings to suggest acute pancreatitis. 4. 4.0 x 3.7 cm fluid density structure in the right upper quadrant just below the pylorus and medial to the gallbladder was 4.4 x 3.3 cm on a study from 06/19/2021 and was present but incompletely visualized on a chest CTA of 09/25/2017 where it was measured at 5-6 cm. Given long-term persistence, features are most suggestive of benign etiology with lymphatics cyst/lymphangioma distinct consideration. 5. Trace free fluid in the cul-de-sac, likely physiologic. Electronically Signed   By: Kennith Center M.D.   On: 09/08/2023 06:43   DG Chest 1 View Result Date: 09/08/2023 CLINICAL DATA:  33 year old female with chest pain and dizziness, diaphoresis. EXAM: CHEST  1 VIEW COMPARISON:  CTA chest 09/25/2017. FINDINGS: Portable AP upright view at 0512 hours. Normal lung volumes and mediastinal contours. Visualized tracheal air column is within normal limits. Allowing for portable technique the lungs are clear. No pneumothorax or pleural effusion. No osseous  abnormality identified.  Negative visible bowel gas. IMPRESSION: Negative portable chest. Electronically Signed   By: Odessa Fleming M.D.   On: 09/08/2023 05:38    Labs: BNP (last 3 results) No results for input(s): "BNP" in the last 8760 hours. Basic Metabolic Panel: Recent Labs  Lab 09/08/23 0441 09/08/23 1221 09/09/23 0417  NA 136 139 139  K 3.6 3.8 3.8  CL 105 108 106  CO2 23 24 25   GLUCOSE 92 99 92  BUN 9 7 <5*  CREATININE 0.85 0.82 0.74  CALCIUM 8.9 8.9 8.9  MG  --  1.9  --    Liver Function Tests: Recent Labs  Lab 09/08/23 0441 09/08/23 1221 09/09/23 0417  AST 20 21 21   ALT 22 23 24   ALKPHOS 31* 35* 29*  BILITOT 0.4 0.5 0.6  PROT 6.8 6.4* 6.0*  ALBUMIN 3.9 3.1* 2.8*   Recent Labs  Lab 09/08/23 0441 09/09/23 0417  LIPASE 1,560* 52*  Urinalysis    Component Value Date/Time   COLORURINE YELLOW 01/24/2022 2133   APPEARANCEUR CLEAR 01/24/2022 2133   LABSPEC 1.019 01/24/2022 2133   PHURINE 6.0 01/24/2022 2133   GLUCOSEU NEGATIVE 01/24/2022 2133   HGBUR NEGATIVE 01/24/2022 2133   BILIRUBINUR NEGATIVE 01/24/2022 2133   KETONESUR NEGATIVE 01/24/2022 2133   PROTEINUR TRACE (A) 01/24/2022 2133   NITRITE NEGATIVE 01/24/2022 2133   LEUKOCYTESUR LARGE (A) 01/24/2022 2133   Sepsis  Labs Recent Labs  Lab 09/08/23 0441 09/09/23 0417  WBC 6.8 4.1   Microbiology No results found for this or any previous visit (from the past 240 hours).  Time coordinating discharge: 25 minutes  SIGNED: Lanae Boast, MD  [facility] 09/09/2023, 3:14 PM  If 7PM-7AM, please contact night-coverage [email or web address]

## 2023-09-09 NOTE — Progress Notes (Signed)
 Dr. Jonathon Bellows with internal med reached out requesting new patient appointment for this patient due to pulm HTN seen on CT. I have sent a message to our office's scheduling team requesting a follow-up appointment, and our office will call the patient with this information.

## 2023-09-09 NOTE — Plan of Care (Signed)
 Patient discharging  Problem: Education: Goal: Knowledge of General Education information will improve Description: Including pain rating scale, medication(s)/side effects and non-pharmacologic comfort measures Outcome: Completed/Met   Problem: Health Behavior/Discharge Planning: Goal: Ability to manage health-related needs will improve Outcome: Completed/Met   Problem: Clinical Measurements: Goal: Ability to maintain clinical measurements within normal limits will improve Outcome: Completed/Met Goal: Will remain free from infection Outcome: Completed/Met Goal: Diagnostic test results will improve Outcome: Completed/Met Goal: Respiratory complications will improve Outcome: Completed/Met Goal: Cardiovascular complication will be avoided Outcome: Completed/Met   Problem: Activity: Goal: Risk for activity intolerance will decrease Outcome: Completed/Met   Problem: Nutrition: Goal: Adequate nutrition will be maintained Outcome: Completed/Met   Problem: Coping: Goal: Level of anxiety will decrease Outcome: Completed/Met   Problem: Elimination: Goal: Will not experience complications related to bowel motility Outcome: Completed/Met Goal: Will not experience complications related to urinary retention Outcome: Completed/Met   Problem: Pain Managment: Goal: General experience of comfort will improve and/or be controlled Outcome: Completed/Met   Problem: Safety: Goal: Ability to remain free from injury will improve Outcome: Completed/Met   Problem: Skin Integrity: Goal: Risk for impaired skin integrity will decrease Outcome: Completed/Met

## 2023-09-09 NOTE — Progress Notes (Signed)
  Echocardiogram 2D Echocardiogram has been performed.  Beth Morrow 09/09/2023, 12:19 PM

## 2023-09-09 NOTE — TOC Initial Note (Signed)
 Transition of Care [facility]) - Initial/Assessment Note    Patient Details  Name: Beth Morrow MRN: [id number] Date of Birth: 05/28/1991  [facility]) CM/SW Contact:    Adrian Prows, RN Phone Number: 09/09/2023, 9:29 AM  Clinical Narrative:                 Spoke w/ pt in room; pt says she lives at home; she plans to return at d/c; pt identified POC Alric Ran (mother) [phone]; she says family will provide transportation; pt verified insurance/PCP; she denied SDOH risks; pt says she does not have DME, HH services, or home oxygen; no TOC needs; TOC is signing off; please place consult if needed.  Expected Discharge Plan: Home/Self Care Barriers to Discharge: Continued Medical Work up   Patient Goals and CMS Choice Patient states their goals for this hospitalization and ongoing recovery are:: home          Expected Discharge Plan and Services   Discharge Planning Services: CM Consult   Living arrangements for the past 2 months: Apartment                                      Prior Living Arrangements/Services Living arrangements for the past 2 months: Apartment Lives with:: Self Patient language and need for interpreter reviewed:: Yes Do you feel safe going back to the place where you live?: Yes      Need for Family Participation in Patient Care: Yes (Comment) Care giver support system in place?: Yes (comment) Current home services:  (n/a) Criminal Activity/Legal Involvement Pertinent to Current Situation/Hospitalization: No - Comment as needed  Activities of Daily Living   ADL Screening (condition at time of admission) Independently performs ADLs?: Yes (appropriate for developmental age) Is the patient deaf or have difficulty hearing?: No Does the patient have difficulty seeing, even when wearing glasses/contacts?: No Does the patient have difficulty concentrating, remembering, or making decisions?: No  Permission  Sought/Granted Permission sought to share information with : Case Manager Permission granted to share information with : Yes, Verbal Permission Granted  Share Information with NAME: Case Manager     Permission granted to share info w Relationship: Alric Ran (mother) [phone]     Emotional Assessment Appearance:: Appears stated age Attitude/Demeanor/Rapport: Gracious Affect (typically observed): Accepting Orientation: : Oriented to Self, Oriented to Place, Oriented to  Time, Oriented to Situation Alcohol / Substance Use: Not Applicable Psych Involvement: No (comment)  Admission diagnosis:  Pancreatitis [K85.90] Elevated lipase [R74.8] Chest pain, unspecified type [R07.9] Patient Active Problem List   Diagnosis Date Noted   Acute pancreatitis without necrosis or infection, unspecified 09/08/2023   Pancreatitis 09/08/2023   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   PCP:  [facility]:   [facility] [address] [place] ([place] - [address] [place]) [place] [address] Phone: [phone] Fax: [phone]  [facility] [address] [place] [address] Phone: [phone] Fax: [phone]     Social Drivers of Health (SDOH) Social History: SDOH Screenings   Food Insecurity: No Food Insecurity (09/09/2023)  Housing: Low Risk  (09/09/2023)  Transportation Needs: No Transportation Needs (09/09/2023)  Utilities: Not At Risk (09/09/2023)  Social Connections: Unknown (09/08/2023)  Tobacco Use: Medium Risk (09/08/2023)   SDOH Interventions: Food Insecurity Interventions: Intervention Not Indicated, Inpatient TOC Housing Interventions: Intervention Not Indicated,  Inpatient TOC Transportation Interventions: Intervention Not Indicated, Inpatient TOC Utilities Interventions: Intervention Not Indicated, Inpatient TOC   Readmission Risk Interventions      No data to display

## 2023-09-12 ENCOUNTER — Ambulatory Visit: Attending: Cardiovascular Disease | Admitting: Cardiovascular Disease

## 2023-09-12 ENCOUNTER — Encounter: Payer: Self-pay | Admitting: Cardiovascular Disease

## 2023-09-12 VITALS — BP 110/86 | HR 65 | Ht 63.0 in | Wt 154.2 lb

## 2023-09-12 DIAGNOSIS — R0789 Other chest pain: Secondary | ICD-10-CM | POA: Insufficient documentation

## 2023-09-12 NOTE — Patient Instructions (Signed)
   Follow-Up: At [facility], you and your health needs are our priority.  As part of our continuing mission to provide you with exceptional heart care, our providers are all part of one team.  This team includes your primary Cardiologist (physician) and Advanced Practice Providers or APPs (Physician Assistants and Nurse Practitioners) who all work together to provide you with the care you need, when you need it.  Your next appointment:   As needed         1st Floor: - Lobby - Registration  - Pharmacy  - Lab - Cafe  2nd Floor: - PV Lab - Diagnostic Testing (echo, CT, nuclear med)  3rd Floor: - Vacant  4th Floor: - TCTS (cardiothoracic surgery) - AFib Clinic - Structural Heart Clinic - Vascular Surgery  - Vascular Ultrasound  5th Floor: - HeartCare Cardiology (general and EP) - Clinical Pharmacy for coumadin, hypertension, lipid, weight-loss medications, and med management appointments    Valet parking services will be available as well.

## 2023-09-12 NOTE — Progress Notes (Signed)
 09/12/2023 Beth Morrow   April 25, 1991  [id number]  Primary Physician [facility], [facility] Cardiologist: Runell Gess MD Nicholes Calamity, [place]  HPI:  [name] is a 33 y.o. thin-appearing divorced African-American female mother of 2 children who works in Chief Financial Officer at [employer].  She was recently admitted for 1 day onset//25 with atypical chest pain.  She does have a history of nephrolithiasis.  Workup was unremarkable other than question of cholecystitis/pancreatitis with elevated lipase.  Her troponins were negative.  EKG showed no acute changes.  Chest CT was negative for dissection, or pulmonary embolism.  Did mention a "generous pulmonary artery suggesting "pulmonary hypertension" although subsequent 2D echo was entirely normal.  Her symptoms have resolved.  She has no cardiac risk factors.  She is adopted so we are unaware of her family history.  No further workup is necessary.   Current Meds  Medication Sig   alum & mag hydroxide-simeth (MAALOX/MYLANTA) 200-200-20 MG/5ML suspension Take 30 mLs by mouth every 6 (six) hours as needed for indigestion or heartburn.   APRI 0.15-30 MG-MCG tablet Take 1 tablet by mouth daily.   chlorhexidine (PERIDEX) 0.12 % solution Use as directed 15 mLs in the mouth or throat 2 (two) times daily.   pantoprazole (PROTONIX) 40 MG tablet Take 1 tablet (40 mg total) by mouth daily.     No Known Allergies  Social History   Socioeconomic History   Marital status: Single    Spouse name: Not on file   Number of children: Not on file   Years of education: Not on file   Highest education level: Not on file  Occupational History   Occupation: [employer]  Tobacco Use   Smoking status: Former    Types: Cigarettes   Smokeless tobacco: Never   Tobacco comments:    Quit August 2018  Vaping Use   Vaping status: Never Used  Substance and Sexual Activity   Alcohol use: No   Drug use: No   Sexual  activity: Yes    Birth control/protection: None, Injection  Other Topics Concern   Not on file  Social History Narrative   Not on file   Social Drivers of Health   Financial Resource Strain: Not on file  Food Insecurity: No Food Insecurity (09/09/2023)   Hunger Vital Sign    Worried About Running Out of Food in the Last Year: Never true    Ran Out of Food in the Last Year: Never true  Transportation Needs: No Transportation Needs (09/09/2023)   PRAPARE - Administrator, Civil Service (Medical): No    Lack of Transportation (Non-Medical): No  Physical Activity: Not on file  Stress: Not on file  Social Connections: Unknown (09/08/2023)   Social Connection and Isolation Panel [NHANES]    Frequency of Communication with Friends and Family: More than three times a week    Frequency of Social Gatherings with Friends and Family: More than three times a week    Attends Religious Services: Patient declined    Database administrator or Organizations: Patient declined    Attends Banker Meetings: Patient declined    Marital Status: Patient declined  Intimate Partner Violence: Not At Risk (09/09/2023)   Humiliation, Afraid, Rape, and Kick questionnaire    Fear of Current or Ex-Partner: No    Emotionally Abused: No    Physically Abused: No    Sexually Abused: No     Review  of Systems: General: negative for chills, fever, night sweats or weight changes.  Cardiovascular: negative for chest pain, dyspnea on exertion, edema, orthopnea, palpitations, paroxysmal nocturnal dyspnea or shortness of breath Dermatological: negative for rash Respiratory: negative for cough or wheezing Urologic: negative for hematuria Abdominal: negative for nausea, vomiting, diarrhea, bright red blood per rectum, melena, or hematemesis Neurologic: negative for visual changes, syncope, or dizziness All other systems reviewed and are otherwise negative except as noted above.    Blood pressure  110/86, pulse 65, height 5\' 3"  (1.6 m), weight 154 lb 3.2 oz (69.9 kg), SpO2 97%, unknown if currently breastfeeding.  General appearance: alert and no distress Neck: no adenopathy, no carotid bruit, no JVD, supple, symmetrical, trachea midline, and thyroid not enlarged, symmetric, no tenderness/mass/nodules Lungs: clear to auscultation bilaterally Heart: regular rate and rhythm, S1, S2 normal, no murmur, click, rub or gallop Extremities: extremities normal, atraumatic, no cyanosis or edema Pulses: 2+ and symmetric Skin: Skin color, texture, turgor normal. No rashes or lesions Neurologic: Grossly normal  EKG not performed today      ASSESSMENT AND PLAN:   Atypical chest pain [name] was recently admitted to the hospital 09/08/23 with atypical chest pain.  Her troponins were negative.  Chest CT showed no dissection/pulmonary embolus or calcification.  There was mention of pulmonary artery enlargement although subsequent 2D echo was entirely normal with normal pulmonary pressures.  She does have a history of GERD.  No further workup is necessary at this time.     Runell Gess MD FACP,FACC,FAHA, [facility] 09/12/2023 8:54 AM

## 2023-09-12 NOTE — Assessment & Plan Note (Signed)
 Ms. Beth Morrow was recently admitted to the hospital 09/08/23 with atypical chest pain.  Her troponins were negative.  Chest CT showed no dissection/pulmonary embolus or calcification.  There was mention of pulmonary artery enlargement although subsequent 2D echo was entirely normal with normal pulmonary pressures.  She does have a history of GERD.  No further workup is necessary at this time.

## 2023-11-29 ENCOUNTER — Other Ambulatory Visit (HOSPITAL_COMMUNITY): Payer: Self-pay
